# Patient Record
Sex: Female | Born: 1955
Health system: Southern US, Community
[De-identification: ages and names within clinical notes are randomized; demographics above are authoritative.]

## PROBLEM LIST (undated history)

## (undated) DIAGNOSIS — R079 Chest pain, unspecified: Secondary | ICD-10-CM

## (undated) DIAGNOSIS — D219 Benign neoplasm of connective and other soft tissue, unspecified: Secondary | ICD-10-CM

## (undated) DIAGNOSIS — T7840XA Allergy, unspecified, initial encounter: Secondary | ICD-10-CM

## (undated) HISTORY — DX: Benign neoplasm of connective and other soft tissue, unspecified: D21.9

## (undated) HISTORY — DX: Allergy, unspecified, initial encounter: T78.40XA

## (undated) HISTORY — DX: Chest pain, unspecified: R07.9

---
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
## 2000-04-26 ENCOUNTER — Emergency Department (HOSPITAL_COMMUNITY): Admission: EM | Admit: 2000-04-26 | Discharge: 2000-04-26 | Payer: Self-pay | Admitting: Emergency Medicine

## 2000-09-26 ENCOUNTER — Emergency Department (HOSPITAL_COMMUNITY): Admission: EM | Admit: 2000-09-26 | Discharge: 2000-09-26 | Payer: Self-pay | Admitting: Emergency Medicine

## 2000-11-12 ENCOUNTER — Other Ambulatory Visit: Admission: RE | Admit: 2000-11-12 | Discharge: 2000-11-12 | Payer: Self-pay | Admitting: *Deleted

## 2002-02-04 ENCOUNTER — Emergency Department (HOSPITAL_COMMUNITY): Admission: EM | Admit: 2002-02-04 | Discharge: 2002-02-05 | Payer: Self-pay | Admitting: Emergency Medicine

## 2003-07-22 ENCOUNTER — Ambulatory Visit (HOSPITAL_COMMUNITY): Admission: RE | Admit: 2003-07-22 | Discharge: 2003-07-22 | Payer: Self-pay

## 2005-03-14 ENCOUNTER — Emergency Department (HOSPITAL_COMMUNITY): Admission: EM | Admit: 2005-03-14 | Discharge: 2005-03-14 | Payer: Self-pay | Admitting: Family Medicine

## 2005-03-24 ENCOUNTER — Emergency Department (HOSPITAL_COMMUNITY): Admission: EM | Admit: 2005-03-24 | Discharge: 2005-03-24 | Payer: Self-pay | Admitting: Emergency Medicine

## 2005-08-05 ENCOUNTER — Ambulatory Visit (HOSPITAL_COMMUNITY): Admission: RE | Admit: 2005-08-05 | Discharge: 2005-08-05 | Payer: Self-pay

## 2005-12-05 ENCOUNTER — Other Ambulatory Visit: Admission: RE | Admit: 2005-12-05 | Discharge: 2005-12-05 | Payer: Self-pay | Admitting: Gynecology

## 2007-08-13 ENCOUNTER — Ambulatory Visit (HOSPITAL_COMMUNITY): Admission: RE | Admit: 2007-08-13 | Discharge: 2007-08-13 | Payer: Self-pay | Admitting: Obstetrics and Gynecology

## 2007-10-09 ENCOUNTER — Other Ambulatory Visit: Admission: RE | Admit: 2007-10-09 | Discharge: 2007-10-09 | Payer: Self-pay | Admitting: Obstetrics and Gynecology

## 2009-02-25 ENCOUNTER — Emergency Department (HOSPITAL_COMMUNITY): Admission: EM | Admit: 2009-02-25 | Discharge: 2009-02-25 | Payer: Self-pay | Admitting: Family Medicine

## 2010-02-07 ENCOUNTER — Emergency Department (HOSPITAL_COMMUNITY): Admission: EM | Admit: 2010-02-07 | Discharge: 2010-02-07 | Payer: Self-pay | Admitting: Family Medicine

## 2010-06-03 ENCOUNTER — Encounter: Payer: Self-pay | Admitting: Obstetrics and Gynecology

## 2010-06-11 ENCOUNTER — Ambulatory Visit: Admit: 2010-06-11 | Payer: Self-pay | Admitting: Obstetrics and Gynecology

## 2010-07-02 ENCOUNTER — Encounter (INDEPENDENT_AMBULATORY_CARE_PROVIDER_SITE_OTHER): Payer: BC Managed Care – PPO | Admitting: Obstetrics and Gynecology

## 2010-07-02 ENCOUNTER — Other Ambulatory Visit (HOSPITAL_COMMUNITY)
Admission: RE | Admit: 2010-07-02 | Discharge: 2010-07-02 | Disposition: A | Discharge status: Home / Self Care | Payer: BC Managed Care – PPO | Source: Ambulatory Visit | Attending: Obstetrics and Gynecology | Admitting: Obstetrics and Gynecology

## 2010-07-02 DIAGNOSIS — Z13220 Encounter for screening for lipoid disorders: Secondary | ICD-10-CM

## 2010-07-02 DIAGNOSIS — Z124 Encounter for screening for malignant neoplasm of cervix: Secondary | ICD-10-CM | POA: Insufficient documentation

## 2010-07-02 DIAGNOSIS — Z01419 Encounter for gynecological examination (general) (routine) without abnormal findings: Secondary | ICD-10-CM

## 2010-07-02 DIAGNOSIS — Z833 Family history of diabetes mellitus: Secondary | ICD-10-CM

## 2010-07-03 ENCOUNTER — Other Ambulatory Visit: Payer: Self-pay | Admitting: Obstetrics and Gynecology

## 2010-07-04 ENCOUNTER — Other Ambulatory Visit: Payer: BC Managed Care – PPO

## 2010-07-04 ENCOUNTER — Ambulatory Visit (INDEPENDENT_AMBULATORY_CARE_PROVIDER_SITE_OTHER): Payer: BC Managed Care – PPO | Admitting: Obstetrics and Gynecology

## 2010-07-04 DIAGNOSIS — D259 Leiomyoma of uterus, unspecified: Secondary | ICD-10-CM

## 2010-07-04 DIAGNOSIS — R1900 Intra-abdominal and pelvic swelling, mass and lump, unspecified site: Secondary | ICD-10-CM

## 2010-08-09 ENCOUNTER — Encounter (INDEPENDENT_AMBULATORY_CARE_PROVIDER_SITE_OTHER): Payer: BC Managed Care – PPO

## 2010-08-09 DIAGNOSIS — Z13820 Encounter for screening for osteoporosis: Secondary | ICD-10-CM

## 2010-08-15 ENCOUNTER — Ambulatory Visit (INDEPENDENT_AMBULATORY_CARE_PROVIDER_SITE_OTHER): Payer: BC Managed Care – PPO | Admitting: Obstetrics and Gynecology

## 2010-08-15 DIAGNOSIS — R635 Abnormal weight gain: Secondary | ICD-10-CM

## 2010-08-15 DIAGNOSIS — R5381 Other malaise: Secondary | ICD-10-CM

## 2010-08-15 DIAGNOSIS — R5383 Other fatigue: Secondary | ICD-10-CM

## 2010-09-11 ENCOUNTER — Other Ambulatory Visit: Payer: Self-pay | Admitting: Obstetrics and Gynecology

## 2010-09-11 DIAGNOSIS — Z1231 Encounter for screening mammogram for malignant neoplasm of breast: Secondary | ICD-10-CM

## 2010-09-21 ENCOUNTER — Ambulatory Visit (HOSPITAL_COMMUNITY)
Admission: RE | Admit: 2010-09-21 | Discharge: 2010-09-21 | Disposition: A | Discharge status: Home / Self Care | Payer: BC Managed Care – PPO | Source: Ambulatory Visit | Attending: Obstetrics and Gynecology | Admitting: Obstetrics and Gynecology

## 2010-09-21 DIAGNOSIS — Z1231 Encounter for screening mammogram for malignant neoplasm of breast: Secondary | ICD-10-CM

## 2012-04-30 ENCOUNTER — Encounter: Payer: Self-pay | Admitting: Gynecology

## 2012-04-30 ENCOUNTER — Other Ambulatory Visit (HOSPITAL_COMMUNITY)
Admission: RE | Admit: 2012-04-30 | Discharge: 2012-04-30 | Disposition: A | Discharge status: Home / Self Care | Payer: BC Managed Care – PPO | Source: Ambulatory Visit | Attending: Gynecology | Admitting: Gynecology

## 2012-04-30 ENCOUNTER — Ambulatory Visit (INDEPENDENT_AMBULATORY_CARE_PROVIDER_SITE_OTHER): Payer: BC Managed Care – PPO | Admitting: Gynecology

## 2012-04-30 VITALS — BP 108/78 | Ht 63.5 in | Wt 216.0 lb

## 2012-04-30 DIAGNOSIS — D259 Leiomyoma of uterus, unspecified: Secondary | ICD-10-CM

## 2012-04-30 DIAGNOSIS — Z01419 Encounter for gynecological examination (general) (routine) without abnormal findings: Secondary | ICD-10-CM

## 2012-04-30 DIAGNOSIS — Z13220 Encounter for screening for lipoid disorders: Secondary | ICD-10-CM

## 2012-04-30 DIAGNOSIS — Z1151 Encounter for screening for human papillomavirus (HPV): Secondary | ICD-10-CM | POA: Insufficient documentation

## 2012-04-30 LAB — CBC WITH DIFFERENTIAL/PLATELET
HCT: 40.9 % (ref 36.0–46.0)
Hemoglobin: 13.7 g/dL (ref 12.0–15.0)
Lymphocytes Relative: 31 % (ref 12–46)
Lymphs Abs: 1.9 10*3/uL (ref 0.7–4.0)
Monocytes Absolute: 0.4 10*3/uL (ref 0.1–1.0)
Monocytes Relative: 6 % (ref 3–12)
Neutro Abs: 3.7 10*3/uL (ref 1.7–7.7)
WBC: 6.1 10*3/uL (ref 4.0–10.5)

## 2012-04-30 LAB — COMPREHENSIVE METABOLIC PANEL
Albumin: 3.8 g/dL (ref 3.5–5.2)
BUN: 12 mg/dL (ref 6–23)
Calcium: 8.8 mg/dL (ref 8.4–10.5)
Chloride: 105 mEq/L (ref 96–112)
Glucose, Bld: 102 mg/dL — ABNORMAL HIGH (ref 70–99)
Potassium: 4.1 mEq/L (ref 3.5–5.3)

## 2012-04-30 LAB — LIPID PANEL
HDL: 35 mg/dL — ABNORMAL LOW (ref >39)
Triglycerides: 99 mg/dL (ref <150)

## 2012-04-30 NOTE — Progress Notes (Signed)
Kathleen Novak 1955/06/19 [id number]        56 y.o.  G1P1001 for annual exam.  Several issues noted below.  Past medical history,surgical history, medications, allergies, family history and social history were all reviewed and documented in the EPIC chart. ROS:  Was performed and pertinent positives and negatives are included in the history.  Exam: Sherrilyn Rist assistant Filed Vitals:   04/30/12 1443  BP: 108/78  Height: 5' 3.5" (1.613 m)  Weight: 216 lb (97.977 kg)   General appearance  Normal Skin grossly normal Head/Neck normal with no cervical or supraclavicular adenopathy thyroid normal Lungs  clear Cardiac RR, without RMG Abdominal  soft, nontender, without masses, organomegaly or hernia Breasts  examined lying and sitting without masses, retractions, discharge or axillary adenopathy. Pelvic  Ext/BUS/vagina  normal   Cervix  normal Pap/HPV  Uterus  grossly normal size, shape and contour, midline and mobile nontender. Exam somewhat limited by abdominal girth.  Adnexa  Without masses or tenderness    Anus and perineum  normal   Rectovaginal  normal sphincter tone without palpated masses or tenderness.    Assessment/Plan:  56 y.o. G56P1001 female for annual exam.   1. Postmenopausal. Patient without menses or any bleeding. Without significant menopausal symptoms. We'll continue to monitor. 2. Lower abdominal discomfort questionable fullness. History of leiomyoma, multiple and small. Exam is normal. Start with GYN ultrasound to rule out nonpalpable abnormalities. Assuming negative then will follow up with gastroenterology as noted below. 3. Pap smear 06/2010. Pap smear/HPV done today. No history of significant abnormal Pap smears previously. If normal plan 5 year follow up. 4. Mammography 09/2010. Patient's overdo and she knows this. Patient agrees to schedule. SBE monthly reviewed. 5. Colonoscopy. Patient's never had a colonoscopy. I given her the name of the [facility] to call to  arrange for colonoscopy as well as to discuss her lower abdominal discomfort.  Patient agrees to arrange.  6. DEXA 07/2010 normal. Plan 5 year interval follow up. Increase calcium vitamin D reviewed. 7. Health maintenance.  Baseline CBC comprehensive metabolic panel vitamin D TSH lipid profile urinalysis ordered. I did ask her to make an appointment to see a general medical doctor  when she arranges for her colonoscopy to establish care for routine health maintenance issue increased arrange this also. Follow up for ultrasound here otherwise 1 year, sooner as needed.    Dara Lords MD, 4:44 PM 04/30/2012

## 2012-04-30 NOTE — Patient Instructions (Signed)
Call to schedule colonoscopy  Call to Schedule your mammogram  Facilities in [place]: 1)  [facility] [place], [place] Phone: [phone] 2)  [facility], [phone] Phone: [phone] 3)  Dr. Yolanda Bonine at [facility] [address] Phone: [phone]     Mammogram A mammogram is an X-ray test to find changes in a woman's breast. You should get a mammogram if:  You are 21 years of age or older  You have risk factors.   Your doctor recommends that you have one.  BEFORE THE TEST  Do not schedule the test the week before your period, especially if your breasts are sore during this time.  On the day of your mammogram:  Wash your breasts and armpits well. After washing, do not put on any deodorant or talcum powder on until after your test.   Eat and drink as you usually do.   Take your medicines as usual.   If you are diabetic and take insulin, make sure you:   Eat before coming for your test.   Take your insulin as usual.   If you cannot keep your appointment, call before the appointment to cancel. Schedule another appointment.  TEST  You will need to undress from the waist up. You will put on a hospital gown.   Your breast will be put on the mammogram machine, and it will press firmly on your breast with a piece of plastic called a compression paddle. This will make your breast flatter so that the machine can X-ray all parts of your breast.   Both breasts will be X-rayed. Each breast will be X-rayed from above and from the side. An X-ray might need to be taken again if the picture is not good enough.   The mammogram will last about 15 to 30 minutes.  AFTER THE TEST Finding out the results of your test Ask when your test results will be ready. Make sure you get your test results.  Document Released: 07/26/2008 Document Revised: 04/18/2011 Document Reviewed:  07/26/2008 [facility] Patient Information 2012 [place], [place].

## 2012-05-01 ENCOUNTER — Other Ambulatory Visit: Payer: Self-pay | Admitting: *Deleted

## 2012-05-01 DIAGNOSIS — E559 Vitamin D deficiency, unspecified: Secondary | ICD-10-CM

## 2012-05-01 LAB — URINALYSIS W MICROSCOPIC + REFLEX CULTURE
Casts: NONE SEEN
Crystals: NONE SEEN
Glucose, UA: NEGATIVE mg/dL
Hgb urine dipstick: NEGATIVE
Leukocytes, UA: NEGATIVE
Specific Gravity, Urine: 1.017 (ref 1.005–1.030)
pH: 5 (ref 5.0–8.0)

## 2012-05-01 LAB — VITAMIN D 25 HYDROXY (VIT D DEFICIENCY, FRACTURES): Vit D, 25-Hydroxy: 13 ng/mL — ABNORMAL LOW (ref 30–89)

## 2012-05-08 ENCOUNTER — Encounter: Payer: Self-pay | Admitting: Obstetrics and Gynecology

## 2012-05-15 ENCOUNTER — Ambulatory Visit (INDEPENDENT_AMBULATORY_CARE_PROVIDER_SITE_OTHER): Payer: BC Managed Care – PPO

## 2012-05-15 ENCOUNTER — Ambulatory Visit (INDEPENDENT_AMBULATORY_CARE_PROVIDER_SITE_OTHER): Payer: BC Managed Care – PPO | Admitting: Gynecology

## 2012-05-15 ENCOUNTER — Encounter: Payer: Self-pay | Admitting: Gynecology

## 2012-05-15 DIAGNOSIS — D259 Leiomyoma of uterus, unspecified: Secondary | ICD-10-CM

## 2012-05-15 DIAGNOSIS — R109 Unspecified abdominal pain: Secondary | ICD-10-CM

## 2012-05-15 DIAGNOSIS — R1030 Lower abdominal pain, unspecified: Secondary | ICD-10-CM

## 2012-05-15 NOTE — Patient Instructions (Signed)
Follow up with [facility] gastroenterology for further evaluation and colonoscopy.

## 2012-05-15 NOTE — Progress Notes (Signed)
Patient also for ultrasound. Has history of some lower abdominal fullness feelings and discomfort. Her pelvic exam was normal and was asked to do an ultrasound to rule out nonpalpable abnormalities. She does have a history of leiomyoma.  Ultrasound shows uterus overall normal size. Endometrial echo 4.3 mm. Multiple small myomas noted the largest 21 mm. #7 total measured. Right and left ovaries visualized and postmenopausal in appearance. Cul-de-sac negative for fluid.  Assessment and plan: Lower, discomfort and fullness feelings. Suspect GI related. Recommend she follow up with [facility] gastroenterology as I recommended and she agrees to arrange and also pursue a colonoscopy. Otherwise from a GYN standpoint continue observation as I think that her leiomyoma are stable is not getting smaller and not the etiology of her discomfort.  I also reviewed all of her lab work that was drawn during her last visit and discussed her marginal glucose of 102 HDL of 35 LDL of 102 and her vitamin D if 13. She is to start vitamin D 2000 units daily. I recommend a recheck of her vitamin D level in 6 months to a year.

## 2012-05-22 ENCOUNTER — Encounter: Payer: Self-pay | Admitting: Gynecology

## 2012-12-30 ENCOUNTER — Emergency Department (HOSPITAL_COMMUNITY)
Admission: EM | Admit: 2012-12-30 | Discharge: 2012-12-31 | Disposition: A | Discharge status: Home / Self Care | Payer: BC Managed Care – PPO | Attending: Emergency Medicine | Admitting: Emergency Medicine

## 2012-12-30 ENCOUNTER — Encounter (HOSPITAL_COMMUNITY): Payer: Self-pay

## 2012-12-30 DIAGNOSIS — R209 Unspecified disturbances of skin sensation: Secondary | ICD-10-CM | POA: Insufficient documentation

## 2012-12-30 DIAGNOSIS — H538 Other visual disturbances: Secondary | ICD-10-CM | POA: Insufficient documentation

## 2012-12-30 DIAGNOSIS — Z8742 Personal history of other diseases of the female genital tract: Secondary | ICD-10-CM | POA: Insufficient documentation

## 2012-12-30 DIAGNOSIS — Z7982 Long term (current) use of aspirin: Secondary | ICD-10-CM | POA: Insufficient documentation

## 2012-12-30 DIAGNOSIS — H5310 Unspecified subjective visual disturbances: Secondary | ICD-10-CM

## 2012-12-30 NOTE — ED Notes (Signed)
Pt c/o headache starting yesterday, lightheaded when standing, and blurred vision to her Right eye starting this afternoon. No neuro deficits noted, speech is clear, no facial droop, no arm drift, grips and strengths are equal bilateral

## 2012-12-30 NOTE — ED Provider Notes (Signed)
CSN: [id number]     Arrival date & time 12/30/12  2108 History     First MD Initiated Contact with Patient 12/30/12 2304     Chief Complaint  Patient presents with  . Blurred Vision   (Consider location/radiation/quality/duration/timing/severity/associated sxs/prior Treatment) HPI This is a 57 year old female who experienced the sudden onset of shaking of her vision at about 6 PM today. She believes, but is not sure, but the shaking is limited to the right eye. The symptoms are mild but make it difficult to read. She has had about 10 episodes and they last several minutes each. There is an associated slight pressure in the right eye but no frank pain. The right eye waters slightly when this occurs. She had a headache yesterday which she attributed to sinus pressure. She has no headache today. She has no new neurologic deficits. She has a several month history of awakening with numbness in her right arm which usually resolves with movement; it is not occurring now. She also has a six-month history of intermittent lightheadedness on standing.   Past Medical History  Diagnosis Date  . Fibroid    Past Surgical History  Procedure Laterality Date  . Cesarean section    . Tonsillectomy     Family History  Problem Relation Age of Onset  . Diabetes Mother   . Hypertension Mother   . Stroke Mother   . Diabetes Father   . Heart disease Father   . Heart disease Brother   . Heart disease Maternal Grandmother    History  Substance Use Topics  . Smoking status: Never Smoker   . Smokeless tobacco: Never Used  . Alcohol Use: No   OB History   Grav Para Term Preterm Abortions TAB SAB Ect Mult Living   1 1 1       1      Review of Systems  All other systems reviewed and are negative.    Allergies  Other  Home Medications   Current Outpatient Rx  Name  Route  Sig  Dispense  Refill  . aspirin 81 MG tablet   Oral   Take 81 mg by mouth daily.         .  Pseudoephedrine-Guaifenesin (MUCINEX D PO)   Oral   Take 2 tablets by mouth daily as needed (runny nose).          BP 112/64  Pulse 74  Temp(Src) 98.4 F (36.9 C) (Oral)  Resp 16  Ht 5\' 3"  (1.6 m)  Wt 190 lb (86.183 kg)  BMI 33.67 kg/m2  SpO2 98%  Physical Exam General: Well-developed, well-nourished female in no acute distress; appearance consistent with age of record HENT: normocephalic; atraumatic Eyes: pupils equal, round and reactive to light; extraocular muscles intact Neck: supple; no carotid bruits Heart: regular rate and rhythm; no murmurs, rubs or gallops Lungs: clear to auscultation bilaterally Abdomen: soft; nondistended Extremities: No deformity; full range of motion; pulses normal Neurologic: Awake, alert and oriented; motor function intact in all extremities and symmetric; no facial droop; normal coordination speech Skin: Warm and dry Psychiatric: Normal mood and affect    ED Course   Procedures (including critical care time)    MDM   Nursing notes and vitals signs, including pulse oximetry, reviewed.  Summary of this visit's results, reviewed by myself:  Labs:  Results for orders placed during the hospital encounter of 12/30/12 (from the past 24 hour(s))  BASIC METABOLIC PANEL     Status: Abnormal  Collection Time    12/30/12 11:40 PM      Result Value Range   Sodium 140  135 - 145 mEq/L   Potassium 3.9  3.5 - 5.1 mEq/L   Chloride 105  96 - 112 mEq/L   CO2 24  19 - 32 mEq/L   Glucose, Bld 89  70 - 99 mg/dL   BUN 9  6 - 23 mg/dL   Creatinine, Ser 8.29  0.50 - 1.10 mg/dL   Calcium 9.1  8.4 - 56.2 mg/dL   GFR calc non Af Amer 73 (*) >90 mL/min   GFR calc Af Amer 85 (*) >90 mL/min  CBC WITH DIFFERENTIAL     Status: None   Collection Time    12/30/12 11:40 PM      Result Value Range   WBC 7.6  4.0 - 10.5 K/uL   RBC 4.91  3.87 - 5.11 MIL/uL   Hemoglobin 14.4  12.0 - 15.0 g/dL   HCT 13.0  86.5 - 78.4 %   MCV 83.9  78.0 - 100.0 fL   MCH  29.3  26.0 - 34.0 pg   MCHC 35.0  30.0 - 36.0 g/dL   RDW 69.6  29.5 - 28.4 %   Platelets 209  150 - 400 K/uL   Neutrophils Relative % 60  43 - 77 %   Neutro Abs 4.5  1.7 - 7.7 K/uL   Lymphocytes Relative 30  12 - 46 %   Lymphs Abs 2.2  0.7 - 4.0 K/uL   Monocytes Relative 8  3 - 12 %   Monocytes Absolute 0.6  0.1 - 1.0 K/uL   Eosinophils Relative 2  0 - 5 %   Eosinophils Absolute 0.2  0.0 - 0.7 K/uL   Basophils Relative 0  0 - 1 %   Basophils Absolute 0.0  0.0 - 0.1 K/uL    Imaging Studies: Ct Head Wo Contrast  12/31/2012   *RADIOLOGY REPORT*  Clinical Data: Right-sided facial numbness and right arm numbness, with right sided blurry vision  CT HEAD WITHOUT CONTRAST  Technique:  Contiguous axial images were obtained from the base of the skull through the vertex without contrast.  Comparison: None.  Findings: No acute intracranial hemorrhage or infarct.  No extra- axial fluid collection.  Calvarium is intact.  Gray-white matter differentiation is preserved.  No midline shift or mass lesion. CSF containing spaces are normal.  Orbital soft tissues are normal.  Paranasal sinuses and mastoid air cells are clear.  IMPRESSION: No acute intracranial process.  Normal head CT.   Original Report Authenticated By: Rise Mu, M.D.      11:23 PM Patient having intermittent symptoms at this time. Patient was observed during these symptoms and no fasciculation of her eyelids were seen and no nystagmus or other movement of her globes was seen.  2:38 AM Discussed with Dr. Amada Jupiter of neurology. He advises the patient followup with ophthalmology. The patient has an eye doctor whom she can followup with. She was advised to return for worsening or changing symptoms. The patient states her symptoms have been improving since being in the ED.  Hanley Seamen, MD 12/31/12 [phone]

## 2012-12-30 NOTE — ED Notes (Addendum)
Pt states the blurred vision is not as bad as it was earlier when she was on her CPU, however pt states her vision is still not normal. Pt states she has never experienced something like this before and it is concerning to her. Pt states she still has difficulty seeing words. Pt wears glasses on a daily basis.

## 2012-12-31 ENCOUNTER — Emergency Department (HOSPITAL_COMMUNITY): Payer: BC Managed Care – PPO

## 2012-12-31 ENCOUNTER — Encounter (HOSPITAL_COMMUNITY): Payer: Self-pay | Admitting: Radiology

## 2012-12-31 LAB — CBC WITH DIFFERENTIAL/PLATELET
Basophils Absolute: 0 10*3/uL (ref 0.0–0.1)
Basophils Relative: 0 % (ref 0–1)
Eosinophils Absolute: 0.2 10*3/uL (ref 0.0–0.7)
Eosinophils Relative: 2 % (ref 0–5)
HCT: 41.2 % (ref 36.0–46.0)
MCH: 29.3 pg (ref 26.0–34.0)
MCHC: 35 g/dL (ref 30.0–36.0)
MCV: 83.9 fL (ref 78.0–100.0)
Monocytes Absolute: 0.6 10*3/uL (ref 0.1–1.0)
Monocytes Relative: 8 % (ref 3–12)
RDW: 14.6 % (ref 11.5–15.5)

## 2012-12-31 LAB — BASIC METABOLIC PANEL
BUN: 9 mg/dL (ref 6–23)
CO2: 24 mEq/L (ref 19–32)
Chloride: 105 mEq/L (ref 96–112)
Creatinine, Ser: 0.86 mg/dL (ref 0.50–1.10)
GFR calc Af Amer: 85 mL/min — ABNORMAL LOW (ref >90)
Glucose, Bld: 89 mg/dL (ref 70–99)

## 2012-12-31 NOTE — ED Notes (Signed)
Pt states she feels "lightheaded"

## 2013-05-03 ENCOUNTER — Encounter: Payer: Self-pay | Admitting: Gynecology

## 2013-05-03 ENCOUNTER — Ambulatory Visit (INDEPENDENT_AMBULATORY_CARE_PROVIDER_SITE_OTHER): Payer: BC Managed Care – PPO | Admitting: Gynecology

## 2013-05-03 VITALS — BP 108/64 | Ht 63.5 in | Wt 213.4 lb

## 2013-05-03 DIAGNOSIS — Z01419 Encounter for gynecological examination (general) (routine) without abnormal findings: Secondary | ICD-10-CM

## 2013-05-03 DIAGNOSIS — N951 Menopausal and female climacteric states: Secondary | ICD-10-CM

## 2013-05-03 DIAGNOSIS — D251 Intramural leiomyoma of uterus: Secondary | ICD-10-CM

## 2013-05-03 LAB — LIPID PANEL
HDL: 32 mg/dL — ABNORMAL LOW (ref >39)
LDL Cholesterol: 129 mg/dL — ABNORMAL HIGH (ref 0–99)
Triglycerides: 90 mg/dL (ref <150)
VLDL: 18 mg/dL (ref 0–40)

## 2013-05-03 LAB — COMPREHENSIVE METABOLIC PANEL
AST: 32 U/L (ref 0–37)
Albumin: 3.9 g/dL (ref 3.5–5.2)
Alkaline Phosphatase: 54 U/L (ref 39–117)
BUN: 14 mg/dL (ref 6–23)
Glucose, Bld: 77 mg/dL (ref 70–99)
Potassium: 4 mEq/L (ref 3.5–5.3)
Sodium: 138 mEq/L (ref 135–145)
Total Bilirubin: 0.4 mg/dL (ref 0.3–1.2)

## 2013-05-03 LAB — CBC WITH DIFFERENTIAL/PLATELET
Basophils Absolute: 0 10*3/uL (ref 0.0–0.1)
Basophils Relative: 0 % (ref 0–1)
Eosinophils Absolute: 0.2 10*3/uL (ref 0.0–0.7)
MCH: 27.9 pg (ref 26.0–34.0)
MCHC: 34.5 g/dL (ref 30.0–36.0)
Neutro Abs: 3.4 10*3/uL (ref 1.7–7.7)
Neutrophils Relative %: 54 % (ref 43–77)
RDW: 15.2 % (ref 11.5–15.5)

## 2013-05-03 LAB — TSH: TSH: 2.347 u[IU]/mL (ref 0.350–4.500)

## 2013-05-03 NOTE — Progress Notes (Signed)
Kathleen Novak August 07, 1955 [id number]        57 y.o.  G1P1001 for annual exam.  Overall doing well. Several issues noted below.  Past medical history,surgical history, problem list, medications, allergies, family history and social history were all reviewed and documented in the EPIC chart.  ROS:  Performed and pertinent positives and negatives are included in the history, assessment and plan .  Exam: Sherrilyn Rist assistant Filed Vitals:   05/03/13 1521  BP: 108/64  Height: 5' 3.5" (1.613 m)  Weight: 213 lb 6.4 oz (96.798 kg)   General appearance  Normal Skin grossly normal Head/Neck normal with no cervical or supraclavicular adenopathy thyroid normal Lungs  clear Cardiac RR, without RMG Abdominal  soft, nontender, without masses, organomegaly or hernia Breasts  examined lying and sitting without masses, retractions, discharge or axillary adenopathy. Pelvic  Ext/BUS/vagina  Normal with atrophic changes  Cervix  Normal with atrophic changes  Uterus  anteverted, normal size, shape and contour, midline and mobile nontender   Adnexa  Without masses or tenderness    Anus and perineum  Normal   Rectovaginal  Normal sphincter tone without palpated masses or tenderness.    Assessment/Plan:  57 y.o. G58P1001 female for annual exam.   1. Postmenopausal/atrophic genital changes. Having some hot flashes. Options to include OTC products, HRT and non-hormonal pharmacologic agents reviewed. Patient wants to try OTC products with soy first. Will followup if continues to be an issue. No bleeding at all. Otherwise doing well without significant vaginal dryness. Is not sexually active. Call if any vaginal bleeding. 2. History of multiple small myomas. Exam shows normal uterine size. We'll continue to monitor with annual exams. 3. Mammography overdue. Patient knows to schedule an agrees to do so. SBE monthly reviewed. 4. Pap/HPV negative 2013. No Pap smear done today. No history of significant abnormal Pap  smears previously. Plan repeat in 3-5 year interval. 5. DEXA 2012 normal. Plan repeat at 5 year interval. Increase calcium vitamin D reviewed. 6. Colonoscopy never. Patient does to schedule an agrees to do so. Benefits of early detection reviewed. 7. Health maintenance. Baseline CBC comprehensive metabolic panel lipid profile TSH urinalysis vitamin D done. Followup for both mammogram and scheduling colonoscopy, otherwise annually.   Note: This document was prepared with digital dictation and possible smart phrase technology. Any transcriptional errors that result from this process are unintentional.   Dara Lords MD, 4:32 PM 05/03/2013

## 2013-05-03 NOTE — Patient Instructions (Addendum)
Schedule colonoscopy with [facility] at [phone] or [facility] at [phone]  Call to Schedule your mammogram  Facilities in [place]: 1)  [facility], [address] Phone: [phone] 2)  [facility]. [facility], [address] Phone: [phone] 3)  Dr. Bertrand at [facility]  [address] Phone: [phone]     Mammogram A mammogram is an X-ray test to find changes in a woman's breast. You should get a mammogram if:  You are 40 years of age or older  You have risk factors.   Your doctor recommends that you have one.  BEFORE THE TEST  Do not schedule the test the week before your period, especially if your breasts are sore during this time.  On the day of your mammogram:  Wash your breasts and armpits well. After washing, do not put on any deodorant or talcum powder on until after your test.   Eat and drink as you usually do.   Take your medicines as usual.   If you are diabetic and take insulin, make sure you:   Eat before coming for your test.   Take your insulin as usual.   If you cannot keep your appointment, call before the appointment to cancel. Schedule another appointment.  TEST  You will need to undress from the waist up. You will put on a hospital gown.   Your breast will be put on the mammogram machine, and it will press firmly on your breast with a piece of plastic called a compression paddle. This will make your breast flatter so that the machine can X-ray all parts of your breast.   Both breasts will be X-rayed. Each breast will be X-rayed from above and from the side. An X-ray might need to be taken again if the picture is not good enough.   The mammogram will last about 15 to 30 minutes.  AFTER THE TEST Finding out the results of your test Ask when your test results will be ready. Make sure you get your test  results.  Document Released: 07/26/2008 Document Revised: 04/18/2011 Document Reviewed: 07/26/2008 [employer] Patient Information 2012 [employer].   

## 2013-05-04 LAB — URINALYSIS W MICROSCOPIC + REFLEX CULTURE
Hgb urine dipstick: NEGATIVE
Ketones, ur: 15 mg/dL — AB
Leukocytes, UA: NEGATIVE
Nitrite: NEGATIVE
Protein, ur: NEGATIVE mg/dL
Urobilinogen, UA: 1 mg/dL (ref 0.0–1.0)
pH: 6 (ref 5.0–8.0)

## 2013-05-04 LAB — VITAMIN D 25 HYDROXY (VIT D DEFICIENCY, FRACTURES): Vit D, 25-Hydroxy: 15 ng/mL — ABNORMAL LOW (ref 30–89)

## 2013-05-05 ENCOUNTER — Other Ambulatory Visit: Payer: Self-pay | Admitting: Women's Health

## 2013-05-05 ENCOUNTER — Other Ambulatory Visit: Payer: Self-pay | Admitting: Gynecology

## 2013-05-05 DIAGNOSIS — E559 Vitamin D deficiency, unspecified: Secondary | ICD-10-CM

## 2013-05-05 DIAGNOSIS — E7800 Pure hypercholesterolemia, unspecified: Secondary | ICD-10-CM

## 2013-05-05 LAB — URINE CULTURE
Colony Count: NO GROWTH
Organism ID, Bacteria: NO GROWTH

## 2013-06-28 ENCOUNTER — Encounter (HOSPITAL_COMMUNITY): Payer: Self-pay | Admitting: Emergency Medicine

## 2013-06-28 ENCOUNTER — Emergency Department (INDEPENDENT_AMBULATORY_CARE_PROVIDER_SITE_OTHER)
Admission: EM | Admit: 2013-06-28 | Discharge: 2013-06-28 | Disposition: A | Discharge status: Home / Self Care | Payer: BC Managed Care – PPO | Source: Home / Self Care | Attending: Emergency Medicine | Admitting: Emergency Medicine

## 2013-06-28 DIAGNOSIS — H6120 Impacted cerumen, unspecified ear: Secondary | ICD-10-CM

## 2013-06-28 MED ORDER — NEOMYCIN-POLYMYXIN-HC 3.5-10000-1 OT SUSP: 3.0000 [drp] | Freq: Four times a day (QID) | OTIC | Status: DC

## 2013-06-28 NOTE — ED Provider Notes (Signed)
  Chief Complaint   Chief Complaint  Patient presents with  . Otalgia    History of Present Illness   Kathleen Novak is a 58 year old female who has had a three-day history of a sensation of her left ear being stopped up and painful. She denies any drainage. The right ear is normal. She denies any fever, chills, headache, nasal congestion, rhinorrhea, sore throat, or adenopathy.  Review of Systems   Other than as noted above, the patient denies any of the following symptoms: Systemic:  No fevers, chills, or sweats. Eye:  No redness, pain, discharge, itching, blurred vision, or diplopia. ENT:  No headache, nasal congestion, sneezing, itching, epistaxis, ear pain, congestion, decreased hearing, ringing in ears, vertigo, or tinnitus.  No oral lesions, sore throat, pain on swallowing, or hoarseness. Neck:  No mass, tenderness or adenopathy. Lungs:  No coughing, wheezing, or shortness of breath. Skin:  No rash or itching.  [place]   Past medical history, family history, social history, meds, and allergies were reviewed.   Physical Examination     Vital signs:  BP 115/82  Pulse 67  Temp(Src) 98.4 F (36.9 C) (Oral)  Resp 16  SpO2 95% General:  Alert and oriented.  In no distress.  Skin warm and dry. Eye:  PERRL, full EOMs, lids and conjunctiva normal.   ENT:  There is a large wax impaction in the left ear canal. This appeared to be situated in the distal ear canal, therefore it was removed with a wax curet with some difficulty. There was no bleeding, but the canal afterwards was irritated. The TM itself is normal, the right canal and TM were normal.  Nasal mucosa not congested and without drainage.  Mucous membranes moist, no oral lesions, normal dentition, pharynx clear.  No cranial or facial pain to palplation. Neck:  Supple, full ROM.  No adenopathy, tenderness or mass.  Thyroid normal. Lungs:  Breath sounds clear and equal bilaterally.  No wheezes, rales or rhonchi. Heart:   Rhythm regular, without extrasystoles.  No gallops or murmers. Skin:  Clear, warm and dry.   Assessment   The encounter diagnosis was Cerumen impaction.  Plan    1.  Meds:  The following meds were prescribed:   Discharge Medication List as of 06/28/2013  1:20 PM      2.  Patient Education/Counseling:  The patient was given appropriate handouts, self care instructions, and instructed in symptomatic relief.  Advised patient not to get any water in the ear for the next week and to wear something over her ear is been going out in the cold weather. Suggested she not use Q-tips and instead use Debrox drops once a month.  3.  Follow up:  The patient was told to follow up here if no better in 3 to 4 days, or sooner if becoming worse in any way, and given some red flag symptoms such as worsening pain or difficulty hearing which would prompt immediate return.       Harden Mo, MD 06/28/13 [phone]

## 2013-06-28 NOTE — ED Notes (Signed)
C/o left ear pain.  Since 2/14  States feels something hard in ear.  Denies any other symptoms.

## 2013-06-28 NOTE — Discharge Instructions (Signed)
Use Debrox drops monthly to prevent recurrence of wax impaction.  Do not use Q-tips.

## 2013-08-12 ENCOUNTER — Encounter (HOSPITAL_COMMUNITY): Payer: Self-pay | Admitting: Emergency Medicine

## 2013-08-12 ENCOUNTER — Emergency Department (HOSPITAL_COMMUNITY)
Admission: EM | Admit: 2013-08-12 | Discharge: 2013-08-12 | Disposition: A | Discharge status: Home / Self Care | Payer: BC Managed Care – PPO | Source: Home / Self Care

## 2013-08-12 DIAGNOSIS — T148 Other injury of unspecified body region: Secondary | ICD-10-CM

## 2013-08-12 DIAGNOSIS — W57XXXA Bitten or stung by nonvenomous insect and other nonvenomous arthropods, initial encounter: Secondary | ICD-10-CM

## 2013-08-12 DIAGNOSIS — Y9229 Other specified public building as the place of occurrence of the external cause: Secondary | ICD-10-CM

## 2013-08-12 MED ORDER — DEXAMETHASONE SODIUM PHOSPHATE 100 MG/10ML IJ SOLN
10.0000 mg | Freq: Once | INTRAMUSCULAR | Status: AC
  Administered 2013-08-12: 10 mg via INTRAMUSCULAR

## 2013-08-12 MED ORDER — DOXYCYCLINE HYCLATE 100 MG PO CAPS: 100.0000 mg | ORAL_CAPSULE | Freq: Two times a day (BID) | ORAL | Status: DC

## 2013-08-12 MED ORDER — DEXAMETHASONE SODIUM PHOSPHATE 10 MG/ML IJ SOLN
INTRAMUSCULAR | Status: AC
  Filled 2013-08-12: qty 1

## 2013-08-12 NOTE — ED Provider Notes (Signed)
CSN: [id number]     Arrival date & time 08/12/13  1853 History   None    Chief Complaint  Patient presents with  . Insect Bite   (Consider location/radiation/quality/duration/timing/severity/associated sxs/prior Treatment) HPI Comments: 58 yo female stayed at a [place] last night and woke up with bug bite on right lateral chest. She notes pain/ burning/ redness/ swelling. She has not taken any OTC or allergy medicine. She notes only the one bite.   Past Medical History  Diagnosis Date  . Fibroid    Past Surgical History  Procedure Laterality Date  . Cesarean section  1984  . Tonsillectomy  1968   Family History  Problem Relation Age of Onset  . Diabetes Mother   . Hypertension Mother   . Stroke Mother   . Diabetes Father   . Heart disease Father   . Heart disease Brother   . Heart disease Maternal Grandmother    History  Substance Use Topics  . Smoking status: Never Smoker   . Smokeless tobacco: Never Used  . Alcohol Use: No   OB History   Grav Para Term Preterm Abortions TAB SAB Ect Mult Living   1 1 1       1      Review of Systems  Skin: Positive for wound.  All other systems reviewed and are negative.    Allergies  Other  Home Medications   Current Outpatient Rx  Name  Route  Sig  Dispense  Refill  . aspirin 81 MG tablet   Oral   Take 81 mg by mouth daily.         Marland Kitchen neomycin-polymyxin-hydrocortisone (CORTISPORIN) 3.5-10000-1 otic suspension   Left Ear   Place 3 drops into the left ear 4 (four) times daily.   10 mL   0    BP 109/78  Pulse 66  Temp(Src) 97.3 F (36.3 C) (Oral)  Resp 18  SpO2 96% Physical Exam  Nursing note and vitals reviewed. Constitutional: She is oriented to person, place, and time. She appears well-developed.  Pulmonary/Chest:    Musculoskeletal: Normal range of motion.  Neurological: She is alert and oriented to person, place, and time.  Skin: Skin is warm and dry. There is erythema.    ED Course  Procedures  (including critical care time) Labs Review Labs Reviewed - No data to display Imaging Review No results found.   MDM   1. Bug bite without infection   If fever/ drainage/ increased pain start antibiotics Doxy 100 mg AD. Take Zyrtec 10 mg AD, hygiene explained   Ardis Hughs, PA-C 08/12/13 2108

## 2013-08-12 NOTE — ED Notes (Signed)
Was staying in a motel in [place] for 3 days.  Noted a red raised area to R post. chest yesterday morning with itching.

## 2013-08-12 NOTE — Discharge Instructions (Signed)
Insect Bite If fever/ drainage/ increased pain start antibiotics. Take Zyrtec 10 mg AD Mosquitoes, flies, fleas, bedbugs, and other insects can bite. Insect bites are different from insect stings. The bite may be red, puffy (swollen), and itchy for 2 to 4 days. Most bites get better on their own. HOME CARE   Do not scratch the bite.  Keep the bite clean and dry. Wash the bite with soap and water.  Put ice on the bite.  Put ice in a plastic bag.  Place a towel between your skin and the bag.  Leave the ice on for 20 minutes, 4 times a day. Do this for the first 2 to 3 days, or as told by your doctor.  You may use medicated lotions or creams to lessen itching as told by your doctor.  Only take medicines as told by your doctor.  If you are given medicines (antibiotics), take them as told. Finish them even if you start to feel better. You may need a tetanus shot if:  You cannot remember when you had your last tetanus shot.  You have never had a tetanus shot.  The injury broke your skin. If you need a tetanus shot and you choose not to have one, you may get tetanus. Sickness from tetanus can be serious. GET HELP RIGHT AWAY IF:   You have more pain, redness, or puffiness.  You see a red line on the skin coming from the bite.  You have a fever.  You have joint pain.  You have a headache or neck pain.  You feel weak.  You have a rash.  You have chest pain, or you are short of breath.  You have belly (abdominal) pain.  You feel sick to your stomach (nauseous) or throw up (vomit).  You feel very tired or sleepy. MAKE SURE YOU:   Understand these instructions.  Will watch your condition.  Will get help right away if you are not doing well or get worse. Document Released: 04/26/2000 Document Revised: 07/22/2011 Document Reviewed: 11/28/2010 [facility] Patient Information 2014 [place], [place].

## 2013-08-13 NOTE — ED Provider Notes (Signed)
Medical screening examination/treatment/procedure(s) were performed by a resident physician or non-physician practitioner and as the supervising physician I was immediately available for consultation/collaboration.  Lynne Leader, MD   Gregor Hams, MD 08/13/13 [phone]

## 2014-03-14 ENCOUNTER — Encounter (HOSPITAL_COMMUNITY): Payer: Self-pay | Admitting: Emergency Medicine

## 2014-05-04 ENCOUNTER — Encounter: Payer: BC Managed Care – PPO | Admitting: Gynecology

## 2014-06-17 ENCOUNTER — Ambulatory Visit (INDEPENDENT_AMBULATORY_CARE_PROVIDER_SITE_OTHER): Payer: BLUE CROSS/BLUE SHIELD | Admitting: Gynecology

## 2014-06-17 ENCOUNTER — Encounter: Payer: Self-pay | Admitting: Gynecology

## 2014-06-17 VITALS — BP 122/80 | Ht 63.0 in | Wt 218.0 lb

## 2014-06-17 DIAGNOSIS — Z01419 Encounter for gynecological examination (general) (routine) without abnormal findings: Secondary | ICD-10-CM

## 2014-06-17 DIAGNOSIS — D251 Intramural leiomyoma of uterus: Secondary | ICD-10-CM

## 2014-06-17 LAB — COMPREHENSIVE METABOLIC PANEL
ALBUMIN: 4 g/dL (ref 3.5–5.2)
ALK PHOS: 59 U/L (ref 39–117)
ALT: 11 U/L (ref 0–35)
AST: 14 U/L (ref 0–37)
BILIRUBIN TOTAL: 0.3 mg/dL (ref 0.2–1.2)
BUN: 11 mg/dL (ref 6–23)
CO2: 27 mEq/L (ref 19–32)
CREATININE: 0.99 mg/dL (ref 0.50–1.10)
Calcium: 9.1 mg/dL (ref 8.4–10.5)
Chloride: 102 mEq/L (ref 96–112)
Glucose, Bld: 83 mg/dL (ref 70–99)
Potassium: 4 mEq/L (ref 3.5–5.3)
Sodium: 137 mEq/L (ref 135–145)
Total Protein: 6.9 g/dL (ref 6.0–8.3)

## 2014-06-17 LAB — CBC WITH DIFFERENTIAL/PLATELET
BASOS ABS: 0 10*3/uL (ref 0.0–0.1)
BASOS PCT: 0 % (ref 0–1)
Eosinophils Absolute: 0.1 10*3/uL (ref 0.0–0.7)
Eosinophils Relative: 2 % (ref 0–5)
HCT: 42.2 % (ref 36.0–46.0)
Hemoglobin: 14.1 g/dL (ref 12.0–15.0)
Lymphocytes Relative: 36 % (ref 12–46)
Lymphs Abs: 2.1 10*3/uL (ref 0.7–4.0)
MCH: 28 pg (ref 26.0–34.0)
MCHC: 33.4 g/dL (ref 30.0–36.0)
MCV: 83.7 fL (ref 78.0–100.0)
MONO ABS: 0.5 10*3/uL (ref 0.1–1.0)
MPV: 10.7 fL (ref 8.6–12.4)
Monocytes Relative: 9 % (ref 3–12)
NEUTROS ABS: 3.1 10*3/uL (ref 1.7–7.7)
NEUTROS PCT: 53 % (ref 43–77)
Platelets: 213 10*3/uL (ref 150–400)
RBC: 5.04 MIL/uL (ref 3.87–5.11)
RDW: 15.3 % (ref 11.5–15.5)
WBC: 5.8 10*3/uL (ref 4.0–10.5)

## 2014-06-17 LAB — LIPID PANEL
Cholesterol: 168 mg/dL (ref 0–200)
HDL: 34 mg/dL — ABNORMAL LOW (ref >39)
LDL Cholesterol: 113 mg/dL — ABNORMAL HIGH (ref 0–99)
TRIGLYCERIDES: 103 mg/dL (ref <150)
Total CHOL/HDL Ratio: 4.9 Ratio
VLDL: 21 mg/dL (ref 0–40)

## 2014-06-17 NOTE — Patient Instructions (Signed)
Schedule your mammogram.  Schedule your colonoscopy.  You may obtain a copy of any labs that were done today by logging onto MyChart as outlined in the instructions provided with your AVS (after visit summary). The office will not call with normal lab results but certainly if there are any significant abnormalities then we will contact you.   Health Maintenance, Female A healthy lifestyle and preventative care can promote health and wellness.  Maintain regular health, dental, and eye exams.  Eat a healthy diet. Foods like vegetables, fruits, whole grains, low-fat dairy products, and lean protein foods contain the nutrients you need without too many calories. Decrease your intake of foods high in solid fats, added sugars, and salt. Get information about a proper diet from your caregiver, if necessary.  Regular physical exercise is one of the most important things you can do for your health. Most adults should get at least 150 minutes of moderate-intensity exercise (any activity that increases your heart rate and causes you to sweat) each week. In addition, most adults need muscle-strengthening exercises on 2 or more days a week.   Maintain a healthy weight. The body mass index (BMI) is a screening tool to identify possible weight problems. It provides an estimate of body fat based on height and weight. Your caregiver can help determine your BMI, and can help you achieve or maintain a healthy weight. For adults 20 years and older:  A BMI below 18.5 is considered underweight.  A BMI of 18.5 to 24.9 is normal.  A BMI of 25 to 29.9 is considered overweight.  A BMI of 30 and above is considered obese.  Maintain normal blood lipids and cholesterol by exercising and minimizing your intake of saturated fat. Eat a balanced diet with plenty of fruits and vegetables. Blood tests for lipids and cholesterol should begin at age 33 and be repeated every 5 years. If your lipid or cholesterol levels are  high, you are over 50, or you are a high risk for heart disease, you may need your cholesterol levels checked more frequently.Ongoing high lipid and cholesterol levels should be treated with medicines if diet and exercise are not effective.  If you smoke, find out from your caregiver how to quit. If you do not use tobacco, do not start.  Lung cancer screening is recommended for adults aged 102 80 years who are at high risk for developing lung cancer because of a history of smoking. Yearly low-dose computed tomography (CT) is recommended for people who have at least a 30-pack-year history of smoking and are a current smoker or have quit within the past 15 years. A pack year of smoking is smoking an average of 1 pack of cigarettes a day for 1 year (for example: 1 pack a day for 30 years or 2 packs a day for 15 years). Yearly screening should continue until the smoker has stopped smoking for at least 15 years. Yearly screening should also be stopped for people who develop a health problem that would prevent them from having lung cancer treatment.  If you are pregnant, do not drink alcohol. If you are breastfeeding, be very cautious about drinking alcohol. If you are not pregnant and choose to drink alcohol, do not exceed 1 drink per day. One drink is considered to be 12 ounces (355 mL) of beer, 5 ounces (148 mL) of wine, or 1.5 ounces (44 mL) of liquor.  Avoid use of street drugs. Do not share needles with anyone. Ask for help  if you need support or instructions about stopping the use of drugs.  High blood pressure causes heart disease and increases the risk of stroke. Blood pressure should be checked at least every 1 to 2 years. Ongoing high blood pressure should be treated with medicines, if weight loss and exercise are not effective.  If you are 32 to 60 years old, ask your caregiver if you should take aspirin to prevent strokes.  Diabetes screening involves taking a blood sample to check your fasting  blood sugar level. This should be done once every 3 years, after age 76, if you are within normal weight and without risk factors for diabetes. Testing should be considered at a younger age or be carried out more frequently if you are overweight and have at least 1 risk factor for diabetes.  Breast cancer screening is essential preventative care for women. You should practice "breast self-awareness." This means understanding the normal appearance and feel of your breasts and may include breast self-examination. Any changes detected, no matter how small, should be reported to a caregiver. Women in their 60s and 30s should have a clinical breast exam (CBE) by a caregiver as part of a regular health exam every 1 to 3 years. After age 36, women should have a CBE every year. Starting at age 62, women should consider having a mammogram (breast X-ray) every year. Women who have a family history of breast cancer should talk to their caregiver about genetic screening. Women at a high risk of breast cancer should talk to their caregiver about having an MRI and a mammogram every year.  Breast cancer gene (BRCA)-related cancer risk assessment is recommended for women who have family members with BRCA-related cancers. BRCA-related cancers include breast, ovarian, tubal, and peritoneal cancers. Having family members with these cancers may be associated with an increased risk for harmful changes (mutations) in the breast cancer genes BRCA1 and BRCA2. Results of the assessment will determine the need for genetic counseling and BRCA1 and BRCA2 testing.  The Pap test is a screening test for cervical cancer. Women should have a Pap test starting at age 61. Between ages 109 and 68, Pap tests should be repeated every 2 years. Beginning at age 75, you should have a Pap test every 3 years as long as the past 3 Pap tests have been normal. If you had a hysterectomy for a problem that was not cancer or a condition that could lead to  cancer, then you no longer need Pap tests. If you are between ages 52 and 73, and you have had normal Pap tests going back 10 years, you no longer need Pap tests. If you have had past treatment for cervical cancer or a condition that could lead to cancer, you need Pap tests and screening for cancer for at least 20 years after your treatment. If Pap tests have been discontinued, risk factors (such as a new sexual partner) need to be reassessed to determine if screening should be resumed. Some women have medical problems that increase the chance of getting cervical cancer. In these cases, your caregiver may recommend more frequent screening and Pap tests.  The human papillomavirus (HPV) test is an additional test that may be used for cervical cancer screening. The HPV test looks for the virus that can cause the cell changes on the cervix. The cells collected during the Pap test can be tested for HPV. The HPV test could be used to screen women aged 74 years and older, and  should be used in women of any age who have unclear Pap test results. After the age of 61, women should have HPV testing at the same frequency as a Pap test.  Colorectal cancer can be detected and often prevented. Most routine colorectal cancer screening begins at the age of 48 and continues through age 10. However, your caregiver may recommend screening at an earlier age if you have risk factors for colon cancer. On a yearly basis, your caregiver may provide home test kits to check for hidden blood in the stool. Use of a small camera at the end of a tube, to directly examine the colon (sigmoidoscopy or colonoscopy), can detect the earliest forms of colorectal cancer. Talk to your caregiver about this at age 70, when routine screening begins. Direct examination of the colon should be repeated every 5 to 10 years through age 19, unless early forms of pre-cancerous polyps or small growths are found.  Hepatitis C blood testing is recommended for  all people born from [birth year] through 1966 and any individual with known risks for hepatitis C.  Practice safe sex. Use condoms and avoid high-risk sexual practices to reduce the spread of sexually transmitted infections (STIs). Sexually active women aged 73 and younger should be checked for Chlamydia, which is a common sexually transmitted infection. Older women with new or multiple partners should also be tested for Chlamydia. Testing for other STIs is recommended if you are sexually active and at increased risk.  Osteoporosis is a disease in which the bones lose minerals and strength with aging. This can result in serious bone fractures. The risk of osteoporosis can be identified using a bone density scan. Women ages 29 and over and women at risk for fractures or osteoporosis should discuss screening with their caregivers. Ask your caregiver whether you should be taking a calcium supplement or vitamin D to reduce the rate of osteoporosis.  Menopause can be associated with physical symptoms and risks. Hormone replacement therapy is available to decrease symptoms and risks. You should talk to your caregiver about whether hormone replacement therapy is right for you.  Use sunscreen. Apply sunscreen liberally and repeatedly throughout the day. You should seek shade when your shadow is shorter than you. Protect yourself by wearing long sleeves, pants, a wide-brimmed hat, and sunglasses year round, whenever you are outdoors.  Notify your caregiver of new moles or changes in moles, especially if there is a change in shape or color. Also notify your caregiver if a mole is larger than the size of a pencil eraser.  Stay current with your immunizations. Document Released: 11/12/2010 Document Revised: 08/24/2012 Document Reviewed: 11/12/2010 [facility] Patient Information 2014 [place].

## 2014-06-17 NOTE — Progress Notes (Signed)
Kathleen Novak 01-21-1956 [id number]        59 y.o.  G1P1001 for annual exam.  Several issues noted below.  Past medical history,surgical history, problem list, medications, allergies, family history and social history were all reviewed and documented as reviewed in the EPIC chart.  ROS:  Performed with pertinent positives and negatives included in the history, assessment and plan.   Additional significant findings :  none   Exam: Kim Counsellor Vitals:   06/17/14 1402  BP: 122/80  Height: 5\' 3"  (1.6 m)  Weight: 218 lb (98.884 kg)   General appearance:  Normal affect, orientation and appearance. Skin: Grossly normal HEENT: Without gross lesions.  No cervical or supraclavicular adenopathy. Thyroid normal.  Lungs:  Clear without wheezing, rales or rhonchi Cardiac: RR, without RMG Abdominal:  Soft, nontender, without masses, guarding, rebound, organomegaly or hernia Breasts:  Examined lying and sitting without masses, retractions, discharge or axillary adenopathy. Pelvic:  Ext/BUS/vagina with mild atrophic changes  Cervix with atrophic changes  Uterus axial to retroverted, normal size, shape and contour, midline and mobile nontender   Adnexa  Without masses or tenderness    Anus and perineum  Normal   Rectovaginal  Normal sphincter tone without palpated masses or tenderness.    Assessment/Plan:  60 y.o. G45P1001 female for annual exam.   1. Postmenopausal/atrophic genital changes. Patient without significant symptoms of hot flashes, night sweats, vaginal dryness or dyspareunia. No vaginal bleeding. Continue to monitor and report any vaginal bleeding. 2. Mammography 2012. Patient knows she is way overdue. Patient will schedule now. SBE monthly reviewed. 3. Colonoscopy never. Patient knows she is way overdue and agrees to schedule with Dr. Collene Mares. 4. Pap smear/HPV negative 04/2012. No Pap smear done today. No history of significant abnormal Pap smears. Plan repeat Pap smear at  3-5 year interval per current screening guidelines. 5. DEXA 2012 normal. Plan repeat furtherance of menopause. Is taking supplemental vitamin D. We'll check vitamin D level today. 6. Difficulty losing weight. Patient admits to not exercising regularly. We'll check baseline TSH and encouraged regular exercise and diet. 7. Health maintenance. Baseline CBC comprehensive metabolic panel lipid profile urinalysis TSH vitamin D ordered. Follow up in one year, sooner as needed.     Anastasio Auerbach MD, 2:35 PM 06/17/2014

## 2014-06-18 LAB — URINALYSIS W MICROSCOPIC + REFLEX CULTURE
BACTERIA UA: NONE SEEN
BILIRUBIN URINE: NEGATIVE
CRYSTALS: NONE SEEN
Casts: NONE SEEN
Glucose, UA: NEGATIVE mg/dL
Hgb urine dipstick: NEGATIVE
Ketones, ur: NEGATIVE mg/dL
Leukocytes, UA: NEGATIVE
Nitrite: NEGATIVE
Protein, ur: NEGATIVE mg/dL
Squamous Epithelial / LPF: NONE SEEN
Urobilinogen, UA: 0.2 mg/dL (ref 0.0–1.0)
pH: 6 (ref 5.0–8.0)

## 2014-06-18 LAB — TSH: TSH: 2.688 u[IU]/mL (ref 0.350–4.500)

## 2014-06-18 LAB — VITAMIN D 25 HYDROXY (VIT D DEFICIENCY, FRACTURES): Vit D, 25-Hydroxy: 40 ng/mL (ref 30–100)

## 2014-06-20 ENCOUNTER — Other Ambulatory Visit: Payer: Self-pay | Admitting: Gynecology

## 2014-06-20 DIAGNOSIS — E7800 Pure hypercholesterolemia, unspecified: Secondary | ICD-10-CM

## 2014-06-20 DIAGNOSIS — E786 Lipoprotein deficiency: Secondary | ICD-10-CM

## 2014-07-21 ENCOUNTER — Other Ambulatory Visit: Payer: Self-pay | Admitting: Gynecology

## 2014-07-21 DIAGNOSIS — Z1231 Encounter for screening mammogram for malignant neoplasm of breast: Secondary | ICD-10-CM

## 2014-07-27 ENCOUNTER — Ambulatory Visit (HOSPITAL_COMMUNITY)
Admission: RE | Admit: 2014-07-27 | Discharge: 2014-07-27 | Disposition: A | Discharge status: Home / Self Care | Payer: BLUE CROSS/BLUE SHIELD | Source: Ambulatory Visit | Attending: Gynecology | Admitting: Gynecology

## 2014-07-27 DIAGNOSIS — Z1231 Encounter for screening mammogram for malignant neoplasm of breast: Secondary | ICD-10-CM | POA: Diagnosis present

## 2015-04-21 ENCOUNTER — Encounter (HOSPITAL_COMMUNITY): Payer: Self-pay | Admitting: Emergency Medicine

## 2015-04-21 ENCOUNTER — Emergency Department (HOSPITAL_COMMUNITY): Payer: Managed Care, Other (non HMO)

## 2015-04-21 ENCOUNTER — Emergency Department (HOSPITAL_COMMUNITY)
Admission: EM | Admit: 2015-04-21 | Discharge: 2015-04-21 | Disposition: A | Discharge status: Home / Self Care | Payer: Managed Care, Other (non HMO) | Attending: Emergency Medicine | Admitting: Emergency Medicine

## 2015-04-21 DIAGNOSIS — Z7982 Long term (current) use of aspirin: Secondary | ICD-10-CM | POA: Diagnosis not present

## 2015-04-21 DIAGNOSIS — S01501A Unspecified open wound of lip, initial encounter: Secondary | ICD-10-CM | POA: Insufficient documentation

## 2015-04-21 DIAGNOSIS — Z86018 Personal history of other benign neoplasm: Secondary | ICD-10-CM | POA: Diagnosis not present

## 2015-04-21 DIAGNOSIS — Y998 Other external cause status: Secondary | ICD-10-CM | POA: Diagnosis not present

## 2015-04-21 DIAGNOSIS — S0990XA Unspecified injury of head, initial encounter: Secondary | ICD-10-CM | POA: Diagnosis not present

## 2015-04-21 DIAGNOSIS — R55 Syncope and collapse: Secondary | ICD-10-CM | POA: Diagnosis present

## 2015-04-21 DIAGNOSIS — Y9389 Activity, other specified: Secondary | ICD-10-CM | POA: Diagnosis not present

## 2015-04-21 DIAGNOSIS — W01198A Fall on same level from slipping, tripping and stumbling with subsequent striking against other object, initial encounter: Secondary | ICD-10-CM | POA: Insufficient documentation

## 2015-04-21 DIAGNOSIS — Z3202 Encounter for pregnancy test, result negative: Secondary | ICD-10-CM | POA: Diagnosis not present

## 2015-04-21 DIAGNOSIS — Y9289 Other specified places as the place of occurrence of the external cause: Secondary | ICD-10-CM | POA: Diagnosis not present

## 2015-04-21 DIAGNOSIS — R531 Weakness: Secondary | ICD-10-CM | POA: Diagnosis not present

## 2015-04-21 DIAGNOSIS — R4020 Unspecified coma: Secondary | ICD-10-CM

## 2015-04-21 LAB — COMPREHENSIVE METABOLIC PANEL
ALBUMIN: 3.8 g/dL (ref 3.5–5.0)
ALK PHOS: 52 U/L (ref 38–126)
ALT: 16 U/L (ref 14–54)
ANION GAP: 8 (ref 5–15)
AST: 18 U/L (ref 15–41)
BUN: 13 mg/dL (ref 6–20)
CALCIUM: 9.3 mg/dL (ref 8.9–10.3)
CO2: 27 mmol/L (ref 22–32)
Chloride: 105 mmol/L (ref 101–111)
Creatinine, Ser: 1.03 mg/dL — ABNORMAL HIGH (ref 0.44–1.00)
GFR calc non Af Amer: 58 mL/min — ABNORMAL LOW (ref >60)
GLUCOSE: 91 mg/dL (ref 65–99)
POTASSIUM: 3.9 mmol/L (ref 3.5–5.1)
SODIUM: 140 mmol/L (ref 135–145)
Total Bilirubin: 0.5 mg/dL (ref 0.3–1.2)
Total Protein: 7.3 g/dL (ref 6.5–8.1)

## 2015-04-21 LAB — I-STAT TROPONIN, ED: Troponin i, poc: 0 ng/mL (ref 0.00–0.08)

## 2015-04-21 LAB — DIFFERENTIAL
BASOS PCT: 0 %
Basophils Absolute: 0 10*3/uL (ref 0.0–0.1)
EOS ABS: 0.2 10*3/uL (ref 0.0–0.7)
EOS PCT: 3 %
Lymphocytes Relative: 31 %
Lymphs Abs: 2.1 10*3/uL (ref 0.7–4.0)
MONO ABS: 0.4 10*3/uL (ref 0.1–1.0)
Monocytes Relative: 6 %
NEUTROS PCT: 60 %
Neutro Abs: 4.2 10*3/uL (ref 1.7–7.7)

## 2015-04-21 LAB — CBC
HCT: 44.3 % (ref 36.0–46.0)
Hemoglobin: 14.5 g/dL (ref 12.0–15.0)
MCH: 28.1 pg (ref 26.0–34.0)
MCHC: 32.7 g/dL (ref 30.0–36.0)
MCV: 85.9 fL (ref 78.0–100.0)
PLATELETS: 199 10*3/uL (ref 150–400)
RBC: 5.16 MIL/uL — AB (ref 3.87–5.11)
RDW: 15 % (ref 11.5–15.5)
WBC: 6.9 10*3/uL (ref 4.0–10.5)

## 2015-04-21 LAB — PROTIME-INR
INR: 1.09 (ref 0.00–1.49)
PROTHROMBIN TIME: 14.3 s (ref 11.6–15.2)

## 2015-04-21 LAB — I-STAT BETA HCG BLOOD, ED (MC, WL, AP ONLY)

## 2015-04-21 LAB — APTT: aPTT: 30 seconds (ref 24–37)

## 2015-04-21 NOTE — ED Notes (Signed)
Today patient was talking on the phone this morning around 0900 when she lost consciousness and remembers waking up on the floor with a busted lip. States her vision was blurred intermittently throughout the day. Didn't come sooner d/t afraid to drive and didn't want to call the ambulance. Only pain is in busted upper lip. Family hx of strokes and MIs.

## 2015-04-21 NOTE — Discharge Instructions (Signed)
1. Medications: usual home medications 2. Treatment: rest, drink plenty of fluids 3. Follow Up: please followup with your primary doctor early next week for discussion of your diagnoses and further evaluation after today's visit; if you do not have a primary care doctor use the resource guide provided to find one; please return to the ER for loss of consciousness, chest pain, shortness of breath, new or worsening symptoms   Emergency Department Resource Guide 1) Find a Doctor and Pay Out of Pocket Although you won't have to find out who is covered by your insurance plan, it is a good idea to ask around and get recommendations. You will then need to call the office and see if the doctor you have chosen will accept you as a new patient and what types of options they offer for patients who are self-pay. Some doctors offer discounts or will set up payment plans for their patients who do not have insurance, but you will need to ask so you aren't surprised when you get to your appointment.  2) Contact Your Local Health Department Not all health departments have doctors that can see patients for sick visits, but many do, so it is worth a call to see if yours does. If you don't know where your local health department is, you can check in your phone book. The CDC also has a tool to help you locate your state's health department, and many state websites also have listings of all of their local health departments.  3) Find a [facility] If your illness is not likely to be very severe or complicated, you may want to try a walk in clinic. These are popping up all over the country in pharmacies, drugstores, and shopping centers. They're usually staffed by nurse practitioners or physician assistants that have been trained to treat common illnesses and complaints. They're usually fairly quick and inexpensive. However, if you have serious medical issues or chronic medical problems, these are probably not your best  option.  No Primary Care Doctor: - Call Health Connect at  [phone] - they can help you locate a primary care doctor that  accepts your insurance, provides certain services, etc. - Physician Referral Service- [phone]  Chronic Pain Problems: Organization         Address  Phone   Notes  [facility]  [phone] Patients need to be referred by their primary care doctor.   Medication Assistance: Organization         Address  Phone   Notes  [facility] Medication [facility] [address] [place], [place] [address] [phone] --Must be a resident of [facility] -- Must have NO insurance coverage whatsoever (no Medicaid/ Medicare, etc.) -- The pt. MUST have a primary care doctor that directs their care regularly and follows them in the community   [facility]ist  [phone]   [employer]  [phone]    Agencies that provide inexpensive medical care: Organization         Address  Phone   Notes  [place]  [phone]   [facility]    [phone]   [facility] [place], [place] [address] [phone]   [place] [phone]   Planned Parenthood    [phone]   [facility]    [phone]   [facility] ° [address], [place] Phone:  ([phone]) [phone], Fax:  [phone] Hours of Operation:  9 am - 6 pm, M-F.  Also accepts Medicaid/Medicare and self-pay.  °[facility] ° [address], [place] Phone: ([phone]) [phone], Fax: [phone]) [phone]. Hours of Operation:  8:30 am - 5:30 pm, M-F.  Also accepts Medicaid and self-pay.  °[facility] [place] [address], [place] Phone: ([phone]) [phone]   °[facility] [address], [place], [place] (336)[phone], Ext. 123 Mondays & Thursdays: 7-9 AM.  First 15  patients are seen on a first come, first serve basis. °  ° °Medicaid-accepting [place] Providers: ° °Organization         Address  Phone   Notes  °[facility] [address], [place] ([phone]) [phone] Also accepts self-pay patients.  °[facility] [address], [place] ° (336) [phone]   °[facility] [address], [place] (336) [phone]   °[facility] [address] [place] [address], [place] (336) [phone]   °Veita Bland [address], [place]  ° ([phone]) [phone] Only accepts [place] Access Medicaid patients after they have their name applied to their card.  ° °Self-Pay (no insurance) in [place]: ° °Organization         Address  Phone   Notes  °Sickle Cell Patients, [facility] [address], [place] ([phone]) [phone]   °[facility] [address], [place] (336) [phone]   °[facility] ° [address], [place] (336) [phone]   °[facility]/Dr. Osei-Bonsu ° [address] [place] [address], [place] or [address], [place] ([phone]) [phone] Phone number for both [place] and [place] locations is the same.  °[facility] [address], [place] ([phone]) [phone]   °[facility] [address] [place] [address], [place] or [address] (336) 852-7530 °(336) [phone]   °[facility] [address], [place] (336) 350-1642, phone; (336) [phone], fax Sees patients 1st and 3rd Saturday of every month.  Must not qualify for public or private insurance (i.e. Medicaid, Medicare, [place] Health Choice, Veterans' Benefits) • Household income should be no more than 200% of the poverty level •The clinic cannot treat you if you are pregnant or think you are pregnant • Sexually transmitted diseases are not treated at the clinic.  ° ° °Dental  Care: °Organization         Address  Phone  Notes  °[place] [facility] [address], [place] (336) [phone] Accepts children up to age 21 who are enrolled in Medicaid or [place] Health Choice; pregnant women with a Medicaid card; and children who have applied for Medicaid or [place] Health Choice, but were declined, whose parents can pay a reduced fee at time of service.  °[place] [facility] [place]  [address], [place] (336) 641-7733 Accepts children up to age 21 who are enrolled in Medicaid or [place] Health Choice; pregnant women with a Medicaid card; and children who have applied for Medicaid or  Health Choice, but were declined, whose parents can pay a reduced fee at time of service.  °[facility] ° [address], [place] (  [phone]) [phone] Patients are seen by appointment only. Walk-ins are not accepted. [employer] will see patients 71 years of age and older. Monday - Tuesday (8am-5pm) Most Wednesdays (8:30-5pm) $30 per visit, cash only  [facility]  [address], [facility] [phone] Patients are seen by appointment only. Walk-ins are not accepted. [place] will see patients 37 years of age and older. One Wednesday Evening (Monthly: Volunteer Based).  $30 per visit, cash only  [place]  [phone] for adults; Children under age 71, call Graduate Pediatric Dentistry at [phone]. Children aged 47-14, please call [phone] to request a pediatric application.  Dental services are provided in all areas of dental care including fillings, crowns and bridges, complete and partial dentures, implants, gum treatment, root canals, and extractions. Preventive care is also provided. Treatment is provided to both adults and children. Patients are selected via a lottery and there is often a waiting list.   [facility] [address] [place]  [phone] [email or web address]   [facility] [address] [place], [place] [phone], Ext. 123 Second and Fourth Thursday of each month, opens at 6:30 AM; Clinic ends at 9 AM.  Patients are seen on a first-come first-served basis, and a limited number are seen during each clinic.   [facility]  [address] [place], [place] [phone]   Eligibility Requirements You must have lived in [place], [place], or [place] counties for at least the last three months.   You cannot be eligible for state or federal sponsored [employer], including [employer], [place], or [employer].   You generally cannot be eligible for healthcare insurance through your employer.    How to apply: Eligibility screenings are held every Tuesday and Wednesday afternoon from 1:00 pm until 4:00 pm. You do not need an appointment for the interview!  [facility] [address] [place], [place]   [place]  [place] Department  [place]  [phone]    Behavioral Health Resources in the Community: Intensive Outpatient Programs Organization         Address  Phone  Notes  [facility] [address], [place], [place] [phone]   [facility] [address] [place], [place]   [facility] [address], [place], [place]   [place] [address]  [place], [place] or [phone]   Substance Abuse Resources Organization         Address  Phone  Notes  [facility]  205-726-1914   [place]  ([phone]) [phone]   [facility] [place]   Chinita Pester  [phone]   Residential & Outpatient Substance Abuse Program  (760) [phone]    Psychological Services Organization         Address  Phone  Notes  [facility] [place]  [place]  [phone]   [place] [address], [place] or (340)[phone]    Mobile Crisis Teams Organization         Address  Phone  Notes  [facility], Mobile Crisis Care Unit  [phone]   [facility]  [address] [place], [place]   [place] [address] °[place] 336-554-5454   ° °Self-Help/Support Groups °Organization         Address  Phone             Notes  °[facility] [place] - variety of support groups  336- [phone] Call for more information  °Narcotics Anonymous (NA), [facility] [address], °[place]  2 meetings at this location  ° °Residential Treatment Programs °Organization         Address  Phone  Notes  °[facility] [address],    °[place]  1-866-801-8205   °[facility] ° [address], [place], [place] [phone]   °[facility] [address], [place] [phone] Admissions: 8am-3pm M-F  °[facility] [address]    °[place], [place] [phone]   °[facility] [address], [place], [place] [phone]   °[facility] [address]  °[place], [place] 336-285-9073   °[facility] - Intensive Outpatient [address], [place], [place] 336-852-3033   °[facility] [address]  °[place], [place] 1-877-615-2722 or 336-784-9470   °[facility] ([facility]) [address] [place], [place] 336-227-7417 Accepts Medicaid  °[facility] [address]  ° [place] 1-800-659-3381 Substance Abuse/Addiction Treatment  ° °[place] Behavioral Health Resources °Organization         Address  Phone  Notes  °[facility]  (888) [phone]   °Julie Brannon, PhD [address] [place], [place]   (336) [phone] or (336) 951-0000   °[facility]   [address] °[place], [place] (336) 349-4454   °[facility] [address], [place], [place] (336) [phone] Insurance/Medicaid/sponsorship through [facility]  °[facility] [address]                                    [place], [place] (336) [phone] Therapy/tele-psych/case  °[facility] [address]  ° [place], [place] (336) [phone]    °Dr. Arfeen  (336) [phone]   °[facility] [place]  [employer] [place] [facility] 1) [address], [place] °2) [address], [place] °3)  [address], [place] (336) 349-3220 °(336) 342-7768 ° °(336) [phone]   °[place] Child Abuse Hotline (336) 342-1394 or (336) 342-3537 (After Hours)    ° ° ° ° °

## 2015-04-21 NOTE — ED Provider Notes (Signed)
CSN: [id number]     Arrival date & time 04/21/15  1711 History   First MD Initiated Contact with Patient 04/21/15 1854     Chief Complaint  Patient presents with  . Loss of Consciousness  . Blurred Vision  . Weakness  . Fall    HPI   Kathleen Novak is a 59 y.o. female with a PMH of uterine fibroid who presents to the ED with loss of consciousness. She states she was talking on the phone this morning around 9 AM, when she lost consciousness and fell. She states she hit her lip on the floor and subsequently woke up. She reports mild headache and dizziness, now resolved. She also reports intermittent blurred vision throughout the day today. She denies fever, chills, chest pain, shortness of breath, N/V, weakness. She reports paresthesia and numbness to her right medial arm earlier today, now resolved.   Past Medical History  Diagnosis Date  . Fibroid    Past Surgical History  Procedure Laterality Date  . Cesarean section  1984  . Tonsillectomy  1968   Family History  Problem Relation Age of Onset  . Diabetes Mother   . Hypertension Mother   . Stroke Mother   . Diabetes Father   . Heart disease Father   . Heart disease Brother   . Heart disease Maternal Grandmother   . Stroke Maternal Grandmother    Social History  Substance Use Topics  . Smoking status: Never Smoker   . Smokeless tobacco: Never Used  . Alcohol Use: No   OB History    Gravida Para Term Preterm AB TAB SAB Ectopic Multiple Living   1 1 1       1        Review of Systems  Constitutional: Negative for fever and chills.  Eyes: Positive for visual disturbance.  Respiratory: Negative for shortness of breath.   Cardiovascular: Negative for chest pain.  Gastrointestinal: Negative for nausea, vomiting, abdominal pain, diarrhea and constipation.  Musculoskeletal: Negative for myalgias, back pain, arthralgias and neck pain.  Neurological: Positive for dizziness, syncope, numbness and headaches. Negative for  weakness and light-headedness.  All other systems reviewed and are negative.     Allergies  Other  Home Medications   Prior to Admission medications   Medication Sig Start Date End Date Taking? Authorizing Provider  aspirin 81 MG tablet Take 81 mg by mouth daily.   Yes Historical Provider, MD    BP 104/68 mmHg  Pulse 58  Temp(Src) 98.7 F (37.1 C) (Oral)  Resp 16  SpO2 98% Physical Exam  Constitutional: She is oriented to person, place, and time. She appears well-developed and well-nourished. No distress.  HENT:  Head: Normocephalic.  Right Ear: External ear normal.  Left Ear: External ear normal.  Nose: Nose normal.  Mouth/Throat: Uvula is midline, oropharynx is clear and moist and mucous membranes are normal.  Superficial wound to right upper lip, hemostatic.  Eyes: Conjunctivae, EOM and lids are normal. Pupils are equal, round, and reactive to light. Right eye exhibits no discharge. Left eye exhibits no discharge. No scleral icterus.  Neck: Normal range of motion. Neck supple.  Cardiovascular: Normal rate, regular rhythm, normal heart sounds, intact distal pulses and normal pulses.   Pulmonary/Chest: Effort normal and breath sounds normal. No respiratory distress. She has no wheezes. She has no rales.  Abdominal: Soft. Normal appearance and bowel sounds are normal. She exhibits no distension and no mass. There is no tenderness. There is no  rigidity, no rebound and no guarding.  Musculoskeletal: Normal range of motion. She exhibits no edema or tenderness.  Neurological: She is alert and oriented to person, place, and time. She has normal strength. No cranial nerve deficit or sensory deficit. Coordination and gait normal. GCS eye subscore is 4. GCS verbal subscore is 5. GCS motor subscore is 6.  Skin: Skin is warm, dry and intact. No rash noted. She is not diaphoretic. No erythema. No pallor.  Psychiatric: She has a normal mood and affect. Her speech is normal and behavior is  normal.  Nursing note and vitals reviewed.   ED Course  Procedures (including critical care time)  Labs Review Labs Reviewed  CBC - Abnormal; Notable for the following:    RBC 5.16 (*)    All other components within normal limits  COMPREHENSIVE METABOLIC PANEL - Abnormal; Notable for the following:    Creatinine, Ser 1.03 (*)    GFR calc non Af Amer 58 (*)    All other components within normal limits  PROTIME-INR  APTT  DIFFERENTIAL  I-STAT TROPOININ, ED  I-STAT BETA HCG BLOOD, ED ([facility], [facility], [facility] ONLY)    Imaging Review Ct Head Wo Contrast  04/21/2015  CLINICAL DATA:  Patient loss consciousness and fell. Intermittent blurred vision EXAM: CT HEAD WITHOUT CONTRAST TECHNIQUE: Contiguous axial images were obtained from the base of the skull through the vertex without intravenous contrast. COMPARISON:  December 11, 2012 FINDINGS: The ventricles are normal in size and configuration. There is no intracranial mass, hemorrhage, extra-axial fluid collection, or midline shift. Gray-white compartments are normal. No acute infarct evident. There is basal ganglia calcification bilaterally, stable. The bony calvarium appears intact. The mastoid air cells are clear. No intraorbital lesions are identified. There is probable cerumen in the left external auditory canal. IMPRESSION: No intracranial mass, hemorrhage, or focal gray - white compartment lesions/acute appearing infarct. No extra-axial fluid collection identified. Basal ganglia calcification is likely physiologic in this age group. There is probable cerumen left external auditory canal. Electronically Signed   By: Lowella Grip III M.D.   On: 04/21/2015 19:45     I have personally reviewed and evaluated these images and lab results as part of my medical decision-making.   EKG Interpretation   Date/Time:  Friday April 21 2015 17:34:38 EST Ventricular Rate:  75 PR Interval:  171 QRS Duration: 86 QT Interval:  397 QTC Calculation: 443 R  Axis:   66 Text Interpretation:  Sinus rhythm Low voltage, precordial leads Confirmed  by Alvino Chapel  MD, Ovid Curd [phone]) on 04/21/2015 8:29:28 PM      MDM   Final diagnoses:  LOC (loss of consciousness)    59 year old female presents with LOC. States she was talking on the phone, when she lost consciousness, fell, and hit her lip on the floor. Reports mild headache and dizziness, now resolved. Also reports intermittent blurred vision throughout the day today. Denies fever, chills, chest pain, shortness of breath, N/V, weakness. Reports paresthesia and numbness to her right medial arm earlier today, now resolved.  Patient is afebrile. Vital signs stable. Superficial wound to right upper lip, hemostatic. Heart regular rate and rhythm. Lungs clear to auscultation bilaterally. Abdomen soft, nontender, nondistended. No lower extremity edema. Normal neuro exam with no focal deficit. Strength, sensation, coordination intact. Patient moves all extremities and ambulates without difficulty.  EKG sinus rhythm, heart rate 75. Troponin negative. CBC negative for leukocytosis or anemia. CMP unremarkable. INR and PTT within normal limits. Head CT negative  for intracranial mass, hemorrhage, or focal gray-white compartment lesions/acute appearing infarct.  HEART score 2 given age and positive family history. Patient low risk per [facility] Syncope Rule. Patient discussed with Dr. Alvino Chapel. Advised given no significant risk factors, patient appropriate for outpatient follow-up. Patient is nontoxic and well-appearing, feel she is stable for discharge at this time. Patient to follow-up with PCP. Return precautions discussed at length. Patient verbalizes her understanding and is in agreement with plan.  BP 104/68 mmHg  Pulse 58  Temp(Src) 98.7 F (37.1 C) (Oral)  Resp 16  SpO2 98%     Marella Chimes, PA-C 04/22/15 0115  Davonna Belling, MD 04/22/15 [phone]

## 2015-09-29 ENCOUNTER — Other Ambulatory Visit: Payer: Self-pay

## 2015-09-29 DIAGNOSIS — Z1231 Encounter for screening mammogram for malignant neoplasm of breast: Secondary | ICD-10-CM

## 2015-10-12 ENCOUNTER — Ambulatory Visit
Admission: RE | Admit: 2015-10-12 | Discharge: 2015-10-12 | Disposition: A | Discharge status: Home / Self Care | Payer: 59 | Source: Ambulatory Visit

## 2015-10-12 ENCOUNTER — Ambulatory Visit: Payer: BLUE CROSS/BLUE SHIELD

## 2015-10-12 DIAGNOSIS — Z1231 Encounter for screening mammogram for malignant neoplasm of breast: Secondary | ICD-10-CM

## 2016-06-14 DIAGNOSIS — Z01419 Encounter for gynecological examination (general) (routine) without abnormal findings: Secondary | ICD-10-CM | POA: Diagnosis not present

## 2016-06-28 DIAGNOSIS — Z130 Encounter for screening for diseases of the blood and blood-forming organs and certain disorders involving the immune mechanism: Secondary | ICD-10-CM | POA: Diagnosis not present

## 2016-06-28 DIAGNOSIS — Z0000 Encounter for general adult medical examination without abnormal findings: Secondary | ICD-10-CM | POA: Diagnosis not present

## 2016-06-28 DIAGNOSIS — Z13220 Encounter for screening for lipoid disorders: Secondary | ICD-10-CM | POA: Diagnosis not present

## 2016-06-28 DIAGNOSIS — Z131 Encounter for screening for diabetes mellitus: Secondary | ICD-10-CM | POA: Diagnosis not present

## 2016-06-28 DIAGNOSIS — Z1329 Encounter for screening for other suspected endocrine disorder: Secondary | ICD-10-CM | POA: Diagnosis not present

## 2016-07-25 DIAGNOSIS — R0683 Snoring: Secondary | ICD-10-CM | POA: Diagnosis not present

## 2016-07-25 DIAGNOSIS — Z833 Family history of diabetes mellitus: Secondary | ICD-10-CM | POA: Diagnosis not present

## 2016-07-25 DIAGNOSIS — R7303 Prediabetes: Secondary | ICD-10-CM | POA: Diagnosis not present

## 2016-10-28 DIAGNOSIS — M79631 Pain in right forearm: Secondary | ICD-10-CM | POA: Diagnosis not present

## 2016-10-28 DIAGNOSIS — L03113 Cellulitis of right upper limb: Secondary | ICD-10-CM | POA: Diagnosis not present

## 2016-12-27 ENCOUNTER — Other Ambulatory Visit: Payer: Self-pay | Admitting: Gynecology

## 2016-12-27 DIAGNOSIS — Z1231 Encounter for screening mammogram for malignant neoplasm of breast: Secondary | ICD-10-CM

## 2017-01-09 ENCOUNTER — Ambulatory Visit: Payer: 59

## 2017-01-17 ENCOUNTER — Ambulatory Visit
Admission: RE | Admit: 2017-01-17 | Discharge: 2017-01-17 | Disposition: A | Discharge status: Home / Self Care | Payer: 59 | Source: Ambulatory Visit | Attending: Gynecology | Admitting: Gynecology

## 2017-01-17 DIAGNOSIS — Z1231 Encounter for screening mammogram for malignant neoplasm of breast: Secondary | ICD-10-CM | POA: Diagnosis not present

## 2017-04-27 ENCOUNTER — Emergency Department (HOSPITAL_COMMUNITY)
Admission: EM | Admit: 2017-04-27 | Discharge: 2017-04-27 | Disposition: A | Discharge status: Home / Self Care | Payer: 59 | Attending: Emergency Medicine | Admitting: Emergency Medicine

## 2017-04-27 ENCOUNTER — Encounter (HOSPITAL_COMMUNITY): Payer: Self-pay | Admitting: Emergency Medicine

## 2017-04-27 DIAGNOSIS — R109 Unspecified abdominal pain: Secondary | ICD-10-CM

## 2017-04-27 DIAGNOSIS — R1084 Generalized abdominal pain: Secondary | ICD-10-CM | POA: Diagnosis not present

## 2017-04-27 DIAGNOSIS — R197 Diarrhea, unspecified: Secondary | ICD-10-CM | POA: Insufficient documentation

## 2017-04-27 DIAGNOSIS — R112 Nausea with vomiting, unspecified: Secondary | ICD-10-CM | POA: Diagnosis not present

## 2017-04-27 DIAGNOSIS — R1013 Epigastric pain: Secondary | ICD-10-CM | POA: Diagnosis not present

## 2017-04-27 LAB — COMPREHENSIVE METABOLIC PANEL
ALT: 16 U/L (ref 14–54)
ANION GAP: 6 (ref 5–15)
AST: 20 U/L (ref 15–41)
Albumin: 3.7 g/dL (ref 3.5–5.0)
Alkaline Phosphatase: 47 U/L (ref 38–126)
BUN: 11 mg/dL (ref 6–20)
CHLORIDE: 108 mmol/L (ref 101–111)
CO2: 26 mmol/L (ref 22–32)
Calcium: 8.9 mg/dL (ref 8.9–10.3)
Creatinine, Ser: 0.84 mg/dL (ref 0.44–1.00)
GFR calc non Af Amer: >60 mL/min (ref >60)
Glucose, Bld: 97 mg/dL (ref 65–99)
POTASSIUM: 3.4 mmol/L — AB (ref 3.5–5.1)
Sodium: 140 mmol/L (ref 135–145)
Total Bilirubin: 0.7 mg/dL (ref 0.3–1.2)
Total Protein: 7.1 g/dL (ref 6.5–8.1)

## 2017-04-27 LAB — CBC
HCT: 43.9 % (ref 36.0–46.0)
HEMOGLOBIN: 14.4 g/dL (ref 12.0–15.0)
MCH: 28.5 pg (ref 26.0–34.0)
MCHC: 32.8 g/dL (ref 30.0–36.0)
MCV: 86.9 fL (ref 78.0–100.0)
Platelets: 186 10*3/uL (ref 150–400)
RBC: 5.05 MIL/uL (ref 3.87–5.11)
RDW: 15 % (ref 11.5–15.5)
WBC: 5.4 10*3/uL (ref 4.0–10.5)

## 2017-04-27 LAB — LIPASE, BLOOD: LIPASE: 25 U/L (ref 11–51)

## 2017-04-27 MED ORDER — GI COCKTAIL ~~LOC~~
30.0000 mL | Freq: Once | ORAL | Status: AC
  Administered 2017-04-27: 30 mL via ORAL
  Filled 2017-04-27: qty 30

## 2017-04-27 NOTE — ED Triage Notes (Signed)
Patient reports that she ate cole slaw from [facility] on Friday then yesterday started having abd pains with n/v/d. Reports n/v gotten better but diarrhea still watery.

## 2017-04-27 NOTE — ED Notes (Signed)
Pt made aware of the need for UA. 

## 2017-04-27 NOTE — ED Provider Notes (Signed)
[facility] DEPT Provider Note  CSN: [id number] Arrival date & time: 04/27/17 1832  Chief Complaint(s) Abdominal Pain and Diarrhea  HPI Aston Lawhorn is a 61 y.o. female   The history is provided by the patient.  Abdominal Pain   This is a new problem. The current episode started 2 days ago. The pain is associated with suspicious food intake ([employer] cole slaw the day prior). The pain is located in the generalized abdominal region (migratory). The quality of the pain is colicky and cramping. The pain is moderate. Associated symptoms include diarrhea, nausea and vomiting (NBNB). Pertinent negatives include fever, hematochezia, melena, constipation and dysuria. The symptoms are aggravated by eating. Relieved by: BM.  Diarrhea   The stool consistency is described as watery. Associated symptoms include abdominal pain and vomiting (NBNB).   No recent travel or ABx. No sick contacts.  Past Medical History Past Medical History:  Diagnosis Date  . Fibroid    There are no active problems to display for this patient.  Home Medication(s) Prior to Admission medications   Medication Sig Start Date End Date Taking? Authorizing Provider  aspirin 81 MG tablet Take 81 mg by mouth daily.    [provider]                                                                                                                                    Past Surgical History Past Surgical History:  Procedure Laterality Date  . CESAREAN SECTION  1984  . TONSILLECTOMY  1968   Family History Family History  Problem Relation Age of Onset  . Diabetes Mother   . Hypertension Mother   . Stroke Mother   . Diabetes Father   . Heart disease Father   . Heart disease Brother   . Heart disease Maternal Grandmother   . Stroke Maternal Grandmother     Social History Social History   Tobacco Use  . Smoking status: Never Smoker  . Smokeless tobacco: Never Used  Substance  Use Topics  . Alcohol use: No    Alcohol/week: 0.0 oz  . Drug use: No   Allergies Other  Review of Systems Review of Systems  Constitutional: Negative for fever.  Gastrointestinal: Positive for abdominal pain, diarrhea, nausea and vomiting (NBNB). Negative for constipation, hematochezia and melena.  Genitourinary: Negative for dysuria.   All other systems are reviewed and are negative for acute change except as noted in the HPI  Physical Exam Vital Signs  I have reviewed the triage vital signs BP 98/82 (BP Location: Left Arm)   Pulse 76   Temp 98.2 F (36.8 C) (Oral)   Resp 20   SpO2 99%   Physical Exam  Constitutional: She is oriented to person, place, and time. She appears well-developed and well-nourished. No distress.  HENT:  Head: Normocephalic and atraumatic.  Nose: Nose normal.  Eyes: Conjunctivae and EOM are normal. Pupils  are equal, round, and reactive to light. Right eye exhibits no discharge. Left eye exhibits no discharge. No scleral icterus.  Neck: Normal range of motion. Neck supple.  Cardiovascular: Normal rate and regular rhythm. Exam reveals no gallop and no friction rub.  No murmur heard. Pulmonary/Chest: Effort normal and breath sounds normal. No stridor. No respiratory distress. She has no rales.  Abdominal: Soft. She exhibits no distension. There is tenderness (mild discomfort) in the epigastric area and left upper quadrant. There is no guarding and no CVA tenderness.  Musculoskeletal: She exhibits no edema or tenderness.  Neurological: She is alert and oriented to person, place, and time.  Skin: Skin is warm and dry. No rash noted. She is not diaphoretic. No erythema.  Psychiatric: She has a normal mood and affect.  Vitals reviewed.   ED Results and Treatments Labs (all labs ordered are listed, but only abnormal results are displayed) Labs Reviewed  COMPREHENSIVE METABOLIC PANEL - Abnormal; Notable for the following components:      Result Value     Potassium 3.4 (*)    All other components within normal limits  LIPASE, BLOOD  CBC                                                                                                                         EKG  EKG Interpretation  Date/Time:    Ventricular Rate:    PR Interval:    QRS Duration:   QT Interval:    QTC Calculation:   R Axis:     Text Interpretation:        Radiology No results found. Pertinent labs & imaging results that were available during my care of the patient were reviewed by me and considered in my medical decision making (see chart for details).  Medications Ordered in ED Medications  gi cocktail (Maalox,Lidocaine,Donnatal) (30 mLs Oral Given 04/27/17 2124)                                                                                                                                    Procedures Procedures  (including critical care time)  Medical Decision Making / ED Course I have reviewed the nursing notes for this encounter and the patient's prior records (if available in EHR or on provided paperwork).    61 y.o. female presents with vomiting, diarrhea for 2 days. Possible suspicious food intake. No oral  intolerance. Rest of history as above.  Patient appears well, not in distress, and with no signs of toxicity or dehydration. Abdomen with mild epigastric and LUQ discomfort but not peritonitic. Rest of the exam as above  Labs grossly reassuring without leukocytosis, significant electrolyte derangements, renal insufficiency.  No evidence of biliary obstruction or pancreatitis.  Most consistent with viral gastroenteritis vs food poisoning.  Doubt appendicitis, diverticulitis, severe colitis, dysentery.    Patient provided with GI cocktail resulting in complete resolution of patient's epigastric discomfort.  She was able to tolerate oral hydration. Will withhold Abx at this time.   Discussed symptomatic treatment with the patient and they will  follow closely with their PCP.   Final Clinical Impression(s) / ED Diagnoses Final diagnoses:  Nausea vomiting and diarrhea  Abdominal cramping   Disposition: Discharge  Condition: Good  I have discussed the results, Dx and Tx plan with the patient who expressed understanding and agree(s) with the plan. Discharge instructions discussed at great length. The patient was given strict return precautions who verbalized understanding of the instructions. No further questions at time of discharge.    ED Discharge Orders    None       Follow Up: [place], [address] [place] [address] [phone]  Schedule an appointment as soon as possible for a visit  As needed      This chart was dictated using voice recognition software.  Despite best efforts to proofread,  errors can occur which can change the documentation meaning.   Fatima Blank, MD 04/27/17 2231

## 2017-06-04 ENCOUNTER — Encounter: Payer: Self-pay | Admitting: Gastroenterology

## 2017-06-05 DIAGNOSIS — Z1211 Encounter for screening for malignant neoplasm of colon: Secondary | ICD-10-CM | POA: Diagnosis not present

## 2017-06-16 DIAGNOSIS — Z01419 Encounter for gynecological examination (general) (routine) without abnormal findings: Secondary | ICD-10-CM | POA: Diagnosis not present

## 2017-06-19 DIAGNOSIS — Z13220 Encounter for screening for lipoid disorders: Secondary | ICD-10-CM | POA: Diagnosis not present

## 2017-06-19 DIAGNOSIS — Z130 Encounter for screening for diseases of the blood and blood-forming organs and certain disorders involving the immune mechanism: Secondary | ICD-10-CM | POA: Diagnosis not present

## 2017-06-19 DIAGNOSIS — Z131 Encounter for screening for diabetes mellitus: Secondary | ICD-10-CM | POA: Diagnosis not present

## 2017-06-19 DIAGNOSIS — Z0000 Encounter for general adult medical examination without abnormal findings: Secondary | ICD-10-CM | POA: Diagnosis not present

## 2017-06-25 ENCOUNTER — Other Ambulatory Visit: Payer: Self-pay

## 2017-06-25 ENCOUNTER — Ambulatory Visit (AMBULATORY_SURGERY_CENTER): Payer: Self-pay | Admitting: *Deleted

## 2017-06-25 VITALS — Ht 63.0 in | Wt 189.8 lb

## 2017-06-25 DIAGNOSIS — Z1211 Encounter for screening for malignant neoplasm of colon: Secondary | ICD-10-CM

## 2017-06-25 MED ORDER — NA SULFATE-K SULFATE-MG SULF 17.5-3.13-1.6 GM/177ML PO SOLN: 1.0000 | Freq: Once | ORAL | 0 refills | Status: AC

## 2017-06-25 NOTE — Progress Notes (Signed)
No egg or soy allergy known to patient  No issues with past sedation with any surgeries  or procedures, no intubation problems  No diet pills per patient No home 02 use per patient  No blood thinners per patient  Pt denies issues with constipation  No A fib or A flutter  EMMI video sent to pt's e mail  Gave pt a 15.00 of suprep coupon in [place]

## 2017-06-27 ENCOUNTER — Encounter: Payer: Self-pay | Admitting: Gastroenterology

## 2017-07-02 DIAGNOSIS — R0681 Apnea, not elsewhere classified: Secondary | ICD-10-CM | POA: Diagnosis not present

## 2017-07-03 DIAGNOSIS — R251 Tremor, unspecified: Secondary | ICD-10-CM | POA: Diagnosis not present

## 2017-07-03 DIAGNOSIS — R2681 Unsteadiness on feet: Secondary | ICD-10-CM | POA: Diagnosis not present

## 2017-07-07 ENCOUNTER — Telehealth: Payer: Self-pay | Admitting: Gastroenterology

## 2017-07-07 NOTE — Telephone Encounter (Signed)
Ok no charge

## 2017-07-09 ENCOUNTER — Encounter: Payer: 59 | Admitting: Gastroenterology

## 2017-07-22 DIAGNOSIS — Z1211 Encounter for screening for malignant neoplasm of colon: Secondary | ICD-10-CM | POA: Diagnosis not present

## 2017-08-12 ENCOUNTER — Encounter: Payer: Self-pay | Admitting: Neurology

## 2017-08-12 ENCOUNTER — Telehealth: Payer: Self-pay | Admitting: Neurology

## 2017-08-12 ENCOUNTER — Ambulatory Visit: Payer: 59 | Admitting: Neurology

## 2017-08-12 VITALS — BP 114/66 | HR 66 | Ht 63.0 in | Wt 192.0 lb

## 2017-08-12 DIAGNOSIS — G250 Essential tremor: Secondary | ICD-10-CM | POA: Diagnosis not present

## 2017-08-12 DIAGNOSIS — R251 Tremor, unspecified: Secondary | ICD-10-CM

## 2017-08-12 DIAGNOSIS — R2689 Other abnormalities of gait and mobility: Secondary | ICD-10-CM | POA: Diagnosis not present

## 2017-08-12 NOTE — Progress Notes (Signed)
Subjective:    Patient ID: Kathleen Novak is a 62 y.o. female.  HPI     Star Age, MD, PhD [facility] [address], [address] [place], [place] [address]  Dear Dr. Jacelyn Grip,   I saw your patient, Kathleen Novak, upon your kind request in my neurologic clinic today for initial consultation of her tremor and gait disorder. The patient is unaccompanied today. As you know, [name] is a 62 year old right-handed woman with an underlying medical history of prediabetes, borderline obesity, and vitamin D deficiency, who reports tremors affecting both hands, left more than right. Symptoms started about a year ago. She denies a family history of tremors or Parkinson's disease. She has 3 brothers. She has had some gait unsteadiness, feels off-balance at times, thankfully no recent falls. I reviewed your office note from 07/03/2017, which you kindly included. She had a recent TSH through her GYN. She had a CTH wo contrast on 04/21/15, after a fall with LOC, and I reviewed the result: IMPRESSION: No intracranial mass, hemorrhage, or focal gray - white compartment lesions/acute appearing infarct. No extra-axial fluid collection identified. Basal ganglia calcification is likely physiologic in this age group. There is probable cerumen left external auditory canal.  She had a CTH wo contrast (for right sided numbness) on 12/31/12, and I reviewed the results: IMPRESSION: No acute intracranial process.  Normal head CT. She lives with her son area she has only one son. She has had some recent increase in stress at work. She feels that her tremor flares up when she is under stress. She has thought about cutting back at work, sometimes she feels depressed. She is a nonsmoker and does not typically utilize alcohol. She does not drink caffeine daily, maybe at the most 1 cup of coffee per day. She does not hydrate very well. She drinks on average 1-1/2 bottles of water per  day and one bottle of lemonade.     Her Past Medical History Is Significant For: Past Medical History:  Diagnosis Date  . Allergy   . Fibroid     Her Past Surgical History Is Significant For: Past Surgical History:  Procedure Laterality Date  . CESAREAN SECTION  1984  . TONSILLECTOMY  1968    Her Family History Is Significant For: Family History  Problem Relation Age of Onset  . Diabetes Mother   . Hypertension Mother   . Stroke Mother   . Diabetes Father   . Heart disease Father   . Heart disease Brother   . Heart disease Maternal Grandmother   . Stroke Maternal Grandmother   . Colon cancer Neg Hx   . Colon polyps Neg Hx   . Esophageal cancer Neg Hx   . Rectal cancer Neg Hx   . Stomach cancer Neg Hx     Her Social History Is Significant For: Social History   Socioeconomic History  . Marital status: Single    Spouse name: Not on file  . Number of children: Not on file  . Years of education: Not on file  . Highest education level: Not on file  Occupational History  . Not on file  Social Needs  . Financial resource strain: Not on file  . Food insecurity:    Worry: Not on file    Inability: Not on file  . Transportation needs:    Medical: Not on file    Non-medical: Not on file  Tobacco Use  . Smoking status: Never Smoker  .  Smokeless tobacco: Never Used  Substance and Sexual Activity  . Alcohol use: No    Alcohol/week: 0.0 oz  . Drug use: No  . Sexual activity: Never    Birth control/protection: Post-menopausal    Comment: 1st intercourse 62 yo--Fewer than 5 partners  Lifestyle  . Physical activity:    Days per week: Not on file    Minutes per session: Not on file  . Stress: Not on file  Relationships  . Social connections:    Talks on phone: Not on file    Gets together: Not on file    Attends religious service: Not on file    Active member of club or organization: Not on file    Attends meetings of clubs or organizations: Not on file     Relationship status: Not on file  Other Topics Concern  . Not on file  Social History Narrative  . Not on file    Her Allergies Are:  Allergies  Allergen Reactions  . Other     Walnuts and brazalian nuts   :   Her Current Medications Are:  Outpatient Encounter Medications as of 08/12/2017  Medication Sig  . aspirin 81 MG tablet Take 81 mg by mouth daily.  . cholecalciferol (VITAMIN D) 1000 units tablet Take 1,000 Units by mouth daily.   No facility-administered encounter medications on file as of 08/12/2017.   :   Review of Systems:  Out of a complete 14 point review of systems, all are reviewed and negative with the exception of these symptoms as listed below:  Review of Systems  Neurological:       Pt presents today to discuss her tremors. Pt notices tremors in her hands and head, mostly in her left hand, even though she is right handed. Pt also reports feeling off balance and has almost tripped down a set of stairs.    Objective:  Neurological Exam  Physical Exam Physical Examination:   Vitals:   08/12/17 0950  BP: 114/66  Pulse: 66   General Examination: The patient is a very pleasant 62 y.o. female in no acute distress. She appears well-developed and well-nourished and well groomed.   HEENT: Normocephalic, atraumatic, pupils are equal, round and reactive to light and accommodation. Corrective eye glasses in place. Extraocular tracking is good without limitation to gaze excursion or nystagmus noted. Normal smooth pursuit is noted. Hearing is grossly intact. Face is symmetric with normal facial animation and normal facial sensation. Speech is clear with no dysarthria noted. There is no hypophonia. There is a very mild intermittent head tremor, slight voice tremor. Neck is supple with full range of passive and active motion. There are no carotid bruits on auscultation. Oropharynx exam reveals: moderate mouth dryness, adequate dental hygiene. Tongue protrudes centrally and  palate elevates symmetrically.    Chest: Clear to auscultation without wheezing, rhonchi or crackles noted.  Heart: S1+S2+0, regular and normal without murmurs, rubs or gallops noted.   Abdomen: Soft, non-tender and non-distended with normal bowel sounds appreciated on auscultation.  Extremities: There is no pitting edema in the distal lower extremities bilaterally. Pedal pulses are intact.  Skin: Warm and dry without trophic changes noted.  Musculoskeletal: exam reveals no obvious joint deformities, tenderness or joint swelling or erythema.   Neurologically:  Mental status: The patient is awake, alert and oriented in all 4 spheres. Her immediate and remote memory, attention, language skills and fund of knowledge are appropriate. There is no evidence of aphasia, agnosia, apraxia  or anomia. Speech is clear with normal prosody and enunciation. Thought process is linear. Mood is normal and affect is normal.  Cranial nerves II - XII are as described above under HEENT exam. In addition: shoulder shrug is normal with equal shoulder height noted. Motor exam: Normal bulk, strength and tone is noted. There is no drift, resting tremor or rebound. She has a slight postural tremor in the right more than left upper extremity, no action tremor, no intention tremor.   On 08/12/2017: on Archimedes spiral drawing she has erratic trembling noted with the right hand, course difficulty with the left, handwriting is legible, not particularly tremulous, not micrographic.  Romberg is negative. Reflexes are 1-2+ throughout. Babinski: Toes are flexor bilaterally. Fine motor skills and coordination: intact with normal finger taps, normal hand movements, normal rapid alternating patting, normal foot taps and normal foot agility.  Cerebellar testing: No dysmetria or intention tremor on finger to nose testing. Heel to shin is unremarkable bilaterally. There is no truncal or gait ataxia.  Sensory exam: intact to light touch,  vibration, temp in the upper and lower extremities.  Gait, station and balance: She stands easily. No veering to one side is noted. No leaning to one side is noted. Posture is age-appropriate and stance is narrow based. Gait shows normal stride length and normal pace. No problems turning are noted. Tandem walk is slow, but doable.   Assessment and Plan:    In summary, Kalea Perine is a very pleasant 62 y.o.-year old female with an Underlying benign medical history with the exception of vitamin D deficiency, who presents for neurologic consultation of her tremor. On examination, she has a very mild head and voice tremor, slight hand tremor which is primarily with posture. Exam otherwise is benign and negative for parkinsonism him a she is reassured in that regard. Her history is not telltale for essential tremor. Nevertheless, she may have a very mild form of essential tremor. She does report increased stress recently which has at times exacerbated her tremor. She is advised regarding typical tremor triggers including dehydration, stress, anxiety, sleep deprivation. She is advised to pursue healthy lifestyle and enough rest at night, with a goal of 7-8 hours of sleep. Furthermore, she is advised to stay better hydrated with water. She is encouraged to talk to about stress management, management of anxiety and depression as well. Symptomatic treatment for tremor can be considered but is typically more difficult to achieve for head tremor as opposed to hand tremors. She is advised to monitor her symptoms.down the road, we may consider symptomatic treatment with Mysoline/primidone or a beta blocker but I would like to hold off at this point. She is advised to proceed with a brain MRI with and without contrast. We will call her with her test results. For the most part, patient is reassured today. I will see her back on an as-needed basis, so long as her brain scan is reassuring and age-appropriate. I answered  all her questions today and the patient was in agreement with the above outlined plan. Thank you very much for allowing me to participate in the care of this nice patient. If I can be of any further assistance to you please do not hesitate to call me at [phone].  Sincerely,   Star Age, MD, PhD

## 2017-08-12 NOTE — Patient Instructions (Addendum)
I don't see any signs of parkinsonism or Parkinson's disease, which is reassuring.  You have a mild head and voice tremor, minimal in your hands.  I would not recommend any symptomatic treatment for your tremor at this point.  As discussed, we will do a brain scan, called MRI and call you with the test results. We will have to schedule you for this on a separate date. This test requires authorization from your insurance, and we will take care of the insurance process. Please talk to your primary Care physician about ways to reduce stress and management of anxiety and depression.  Please try to hydrate better with water. 6-8 cups of water per day would be ideal. I will see you back as needed.

## 2017-08-12 NOTE — Telephone Encounter (Signed)
pending faxed clinical notes case # [id number].

## 2017-08-13 ENCOUNTER — Telehealth: Payer: Self-pay | Admitting: Neurology

## 2017-08-13 NOTE — Telephone Encounter (Signed)
[employer] Auth: [id number] (exp. 08/12/17 to 09/26/17)   lvm for pt to call back about scheduling mri

## 2018-07-15 ENCOUNTER — Ambulatory Visit (HOSPITAL_COMMUNITY)
Admission: EM | Admit: 2018-07-15 | Discharge: 2018-07-15 | Discharge status: Against Medical Advice | Payer: 59 | Source: Home / Self Care

## 2018-07-15 ENCOUNTER — Other Ambulatory Visit: Payer: Self-pay

## 2018-07-15 ENCOUNTER — Emergency Department (HOSPITAL_COMMUNITY): Payer: 59

## 2018-07-15 ENCOUNTER — Emergency Department (HOSPITAL_COMMUNITY)
Admission: EM | Admit: 2018-07-15 | Discharge: 2018-07-15 | Disposition: A | Discharge status: Home / Self Care | Payer: 59 | Attending: Emergency Medicine | Admitting: Emergency Medicine

## 2018-07-15 ENCOUNTER — Encounter (HOSPITAL_COMMUNITY): Payer: Self-pay | Admitting: Emergency Medicine

## 2018-07-15 DIAGNOSIS — R079 Chest pain, unspecified: Secondary | ICD-10-CM | POA: Diagnosis not present

## 2018-07-15 DIAGNOSIS — Z79899 Other long term (current) drug therapy: Secondary | ICD-10-CM | POA: Insufficient documentation

## 2018-07-15 DIAGNOSIS — R0602 Shortness of breath: Secondary | ICD-10-CM | POA: Diagnosis not present

## 2018-07-15 DIAGNOSIS — M79602 Pain in left arm: Secondary | ICD-10-CM | POA: Diagnosis not present

## 2018-07-15 DIAGNOSIS — Z7982 Long term (current) use of aspirin: Secondary | ICD-10-CM | POA: Insufficient documentation

## 2018-07-15 LAB — CBC
HCT: 42.8 % (ref 36.0–46.0)
Hemoglobin: 13.6 g/dL (ref 12.0–15.0)
MCH: 27.9 pg (ref 26.0–34.0)
MCHC: 31.8 g/dL (ref 30.0–36.0)
MCV: 87.7 fL (ref 80.0–100.0)
Platelets: 196 10*3/uL (ref 150–400)
RBC: 4.88 MIL/uL (ref 3.87–5.11)
RDW: 14.5 % (ref 11.5–15.5)
WBC: 6.3 10*3/uL (ref 4.0–10.5)
nRBC: 0 % (ref 0.0–0.2)

## 2018-07-15 LAB — BASIC METABOLIC PANEL
ANION GAP: 8 (ref 5–15)
BUN: 16 mg/dL (ref 8–23)
CO2: 23 mmol/L (ref 22–32)
Calcium: 8.8 mg/dL — ABNORMAL LOW (ref 8.9–10.3)
Chloride: 108 mmol/L (ref 98–111)
Creatinine, Ser: 0.89 mg/dL (ref 0.44–1.00)
GFR calc non Af Amer: >60 mL/min (ref >60)
Glucose, Bld: 106 mg/dL — ABNORMAL HIGH (ref 70–99)
Potassium: 3.9 mmol/L (ref 3.5–5.1)
Sodium: 139 mmol/L (ref 135–145)

## 2018-07-15 LAB — I-STAT TROPONIN, ED
TROPONIN I, POC: 0 ng/mL (ref 0.00–0.08)
Troponin i, poc: 0 ng/mL (ref 0.00–0.08)

## 2018-07-15 MED ORDER — SODIUM CHLORIDE 0.9% FLUSH: 3.0000 mL | Freq: Once | INTRAVENOUS | Status: DC

## 2018-07-15 NOTE — ED Notes (Signed)
Pt c/o L arm achey feeling, states she wants a heart work up due to family history, told patient we do not do heart work ups here, offered to do EKG prior to going to ER, pt refused, states she would head striaght to ER and have it done there.

## 2018-07-15 NOTE — ED Provider Notes (Signed)
[facility] EMERGENCY DEPARTMENT Provider Note   CSN: [id number] Arrival date & time: 07/15/18  1600    History   Chief Complaint Chief Complaint  Patient presents with  . Chest Pain    HPI Kathleen Novak is a 63 y.o. female.     Patient is a 63 year old female with past medical history of fibroids.  She presents today with complaints of shortness of breath and discomfort in her left arm.  She states that she has been experiencing shortness of breath with exertion for the past 6 months.  Over the past several months she has been having heaviness in her left arm that worsened earlier today.  She describes an "ache" in her left upper arm and a small twinge of discomfort in her chest.  She denies any shortness of breath, diaphoresis, or nausea.  She does have a family history with her brother and father both having had bypass surgery.  The history is provided by the patient.  Chest Pain  Chest pain location: Left arm. Pain quality: aching   Pain radiates to:  Does not radiate Pain severity:  Mild Timing:  Intermittent Progression:  Worsening Chronicity:  New Worsened by:  Nothing Ineffective treatments:  None tried   Past Medical History:  Diagnosis Date  . Allergy   . Fibroid     There are no active problems to display for this patient.   Past Surgical History:  Procedure Laterality Date  . CESAREAN SECTION  1984  . TONSILLECTOMY  1968     OB History    Gravida  1   Para  1   Term  1   Preterm      AB      Living  1     SAB      TAB      Ectopic      Multiple      Live Births               Home Medications    Prior to Admission medications   Medication Sig Start Date End Date Taking? Authorizing Provider  aspirin 81 MG tablet Take 81 mg by mouth daily.    [provider]  cholecalciferol (VITAMIN D) 1000 units tablet Take 1,000 Units by mouth daily.    [provider]    Family  History Family History  Problem Relation Age of Onset  . Diabetes Mother   . Hypertension Mother   . Stroke Mother   . Diabetes Father   . Heart disease Father   . Heart disease Brother   . Heart disease Maternal Grandmother   . Stroke Maternal Grandmother   . Colon cancer Neg Hx   . Colon polyps Neg Hx   . Esophageal cancer Neg Hx   . Rectal cancer Neg Hx   . Stomach cancer Neg Hx     Social History Social History   Tobacco Use  . Smoking status: Never Smoker  . Smokeless tobacco: Never Used  Substance Use Topics  . Alcohol use: No    Alcohol/week: 0.0 standard drinks  . Drug use: No     Allergies   Other   Review of Systems Review of Systems  Cardiovascular: Positive for chest pain.  All other systems reviewed and are negative.    Physical Exam Updated Vital Signs BP 114/72 (BP Location: Left Arm)   Pulse 69   Temp 98.2 F (36.8 C) (Oral)   Resp 17  SpO2 97%   Physical Exam Vitals signs and nursing note reviewed.  Constitutional:      General: She is not in acute distress.    Appearance: She is well-developed. She is not diaphoretic.  HENT:     Head: Normocephalic and atraumatic.  Neck:     Musculoskeletal: Normal range of motion and neck supple.  Cardiovascular:     Rate and Rhythm: Normal rate and regular rhythm.     Heart sounds: No murmur. No friction rub. No gallop.   Pulmonary:     Effort: Pulmonary effort is normal. No respiratory distress.     Breath sounds: Normal breath sounds. No wheezing.  Abdominal:     General: Bowel sounds are normal. There is no distension.     Palpations: Abdomen is soft.     Tenderness: There is no abdominal tenderness.  Musculoskeletal: Normal range of motion.     Right lower leg: She exhibits no tenderness. No edema.     Left lower leg: She exhibits no tenderness. No edema.  Skin:    General: Skin is warm and dry.  Neurological:     Mental Status: She is alert and oriented to person, place, and time.       ED Treatments / Results  Labs (all labs ordered are listed, but only abnormal results are displayed) Labs Reviewed  BASIC METABOLIC PANEL - Abnormal; Notable for the following components:      Result Value   Glucose, Bld 106 (*)    Calcium 8.8 (*)    All other components within normal limits  CBC  I-STAT TROPONIN, ED  I-STAT TROPONIN, ED    EKG None  Radiology Dg Chest 2 View  Result Date: 07/15/2018 CLINICAL DATA:  Mid chest pain 1-2 days. EXAM: CHEST - 2 VIEW COMPARISON:  None. FINDINGS: The heart size and mediastinal contours are within normal limits. Both lungs are clear. The visualized skeletal structures are unremarkable. IMPRESSION: No active cardiopulmonary disease. Electronically Signed   By: Ashley Royalty M.D.   On: 07/15/2018 17:11    Procedures Procedures (including critical care time)  Medications Ordered in ED Medications  sodium chloride flush (NS) 0.9 % injection 3 mL (has no administration in time range)     Initial Impression / Assessment and Plan / ED Course  I have reviewed the triage vital signs and the nursing notes.  Pertinent labs & imaging results that were available during my care of the patient were reviewed by me and considered in my medical decision making (see chart for details).  Patient is a 63 year old female presenting with complaints of an achy feeling to her left arm since yesterday.  She describes mild discomfort in the front of her chest.  This was unrelated to exertion.  Work-up today reveals negative troponin x2 and unchanged EKG.  Discussion regarding the disposition with the patient has resulted in the decision to allow her to go home and follow-up with cardiology as an outpatient.  She will be given the contact information for the [facility] cardiology clinic with whom she can call and make a follow-up appointment.  She is to return in the meantime if her symptoms worsen.  Final Clinical Impressions(s) / ED Diagnoses    Final diagnoses:  None    ED Discharge Orders    None       Veryl Speak, MD 07/15/18 2212

## 2018-07-15 NOTE — Discharge Instructions (Signed)
Follow-up with the [facility] cardiology clinic in the next few days.  Their contact information has been provided in this discharge summary for you to call and make these arrangements.

## 2018-07-15 NOTE — ED Notes (Signed)
Patient verbalizes understanding of discharge instructions. Opportunity for questioning and answers were provided. Armband removed by staff, pt discharged from ED ambulatory.   

## 2018-07-15 NOTE — ED Triage Notes (Signed)
Pt c/o chest ache and left arm pain x 1 day, worsening last night. Denies shortness of breath/nausea/vomiting. Endorses lightheadedness with ambulation.

## 2018-08-27 DIAGNOSIS — R2681 Unsteadiness on feet: Secondary | ICD-10-CM | POA: Diagnosis not present

## 2018-08-27 DIAGNOSIS — R251 Tremor, unspecified: Secondary | ICD-10-CM | POA: Diagnosis not present

## 2018-08-27 DIAGNOSIS — R5383 Other fatigue: Secondary | ICD-10-CM | POA: Diagnosis not present

## 2018-09-02 ENCOUNTER — Telehealth: Payer: Self-pay

## 2018-09-02 NOTE — Telephone Encounter (Signed)
   Cardiac Questionnaire:    Since your last visit or hospitalization:    1. Have you been having new or worsening chest pain? No, just sharp pain in left arm toward shoulder and radiates to back   2. Have you been having new or worsening shortness of breath? No  3. Have you been having new or worsening leg swelling, wt gain, or increase in abdominal girth (pants fitting more tightly)? Yes, Ankle swelling   4. Have you had any passing out spells?  No    *A YES to any of these questions would result in the appointment being kept. *If all the answers to these questions are NO, we should indicate that given the current situation regarding the worldwide coronarvirus pandemic, at the recommendation of the CDC, we are looking to limit gatherings in our waiting area, and thus will reschedule their appointment beyond four weeks from today.   _____________   LHTDS-28 Pre-Screening Questions:  . Do you currently have a fever? No . Have you recently travelled on a cruise, internationally, or to [place], [place], [place], [place], [place], or [place], [place] [employer]) ? No  . Have you been in contact with someone that is currently pending confirmation of Covid19 testing or has been confirmed to have the [place] virus? No Are you currently experiencing fatigue or cough? No

## 2018-09-03 NOTE — Progress Notes (Signed)
CARDIOLOGY CONSULT NOTE       Patient ID: Kathleen Novak MRN: [id number] DOB/AGE: 1955-10-12 63 y.o.  Admit date: (Not on file) Referring Physician: [name],[name]  Primary Physician: [name], MD Primary Cardiologist: [name] Reason for Consultation: Chest pain  Active Problems:   * No active hospital problems. *  Referred by [name] for chest pain  HPI:  63 y.o. with family history of premature CAD. Seen in ER for chest pain 07/15/18.  Complaints of shortness of breath and discomfort in her left arm.  She states that she has been experiencing shortness of breath with exertion for the past 6 months.  Over the past several months she has been having heaviness in her left arm that worsened precipitating ER visit She describes an "ache" in her left upper arm and a small twinge of discomfort in her chest She r/o CXR no active disease no cardiac enlargement normal mediastinum ECG 07/16/18 NSR rate 78 no acute changes poor R wave progression cannot r/o previous anterior MI.     Saw primary 09/06/18 tremor in both hands , unsteady gait and fatigue   Since ER just muscular left arm pain and exertional dyspnea   Use to travel a lot for work and notes more nasal congestion and vertigo  ROS All other systems reviewed and negative except as noted above  Past Medical History:  Diagnosis Date  . Allergy   . Chest pain   . Fibroid     Family History  Problem Relation Age of Onset  . Diabetes Mother   . Hypertension Mother   . Stroke Mother   . Diabetes Father   . Heart disease Father   . Heart disease Brother   . Heart disease Maternal Grandmother   . Stroke Maternal Grandmother   . Colon cancer Neg Hx   . Colon polyps Neg Hx   . Esophageal cancer Neg Hx   . Rectal cancer Neg Hx   . Stomach cancer Neg Hx     Social History   Socioeconomic History  . Marital status: Single    Spouse name: Not on file  . Number of children: Not on file  . Years of education: Not on  file  . Highest education level: Not on file  Occupational History  . Not on file  Social Needs  . Financial resource strain: Not on file  . Food insecurity:    Worry: Not on file    Inability: Not on file  . Transportation needs:    Medical: Not on file    Non-medical: Not on file  Tobacco Use  . Smoking status: Never Smoker  . Smokeless tobacco: Never Used  Substance and Sexual Activity  . Alcohol use: No    Alcohol/week: 0.0 standard drinks  . Drug use: No  . Sexual activity: Never    Birth control/protection: Post-menopausal    Comment: 1st intercourse 63 yo--Fewer than 5 partners  Lifestyle  . Physical activity:    Days per week: Not on file    Minutes per session: Not on file  . Stress: Not on file  Relationships  . Social connections:    Talks on phone: Not on file    Gets together: Not on file    Attends religious service: Not on file    Active member of club or organization: Not on file    Attends meetings of clubs or organizations: Not on file    Relationship status: Not on file  .  Intimate partner violence:    Fear of current or ex partner: Not on file    Emotionally abused: Not on file    Physically abused: Not on file    Forced sexual activity: Not on file  Other Topics Concern  . Not on file  Social History Narrative  . Not on file    Past Surgical History:  Procedure Laterality Date  . CESAREAN SECTION  1984  . TONSILLECTOMY  1968        Physical Exam: Blood pressure 114/72, pulse 70, height 5\' 3"  (1.6 m), weight 91.9 kg, SpO2 95 %.   Affect appropriate Healthy:  appears stated age 33: normal Neck supple with no adenopathy JVP normal no bruits no thyromegaly Lungs clear with no wheezing and good diaphragmatic motion Heart:  S1/S2 no murmur, no rub, gallop or click PMI normal Abdomen: benighn, BS positve, no tenderness, no AAA no bruit.  No HSM or HJR Distal pulses intact with no bruits No edema Neuro non-focal Skin warm and dry  No muscular weakness   Labs:   Lab Results  Component Value Date   WBC 6.3 07/15/2018   HGB 13.6 07/15/2018   HCT 42.8 07/15/2018   MCV 87.7 07/15/2018   PLT 196 07/15/2018   No results for input(s): NA, K, CL, CO2, BUN, CREATININE, CALCIUM, PROT, BILITOT, ALKPHOS, ALT, AST, GLUCOSE in the last 168 hours.  Invalid input(s): LABALBU No results found for: CKTOTAL, CKMB, CKMBINDEX, TROPONINI  Lab Results  Component Value Date   CHOL 168 06/17/2014   CHOL 179 05/03/2013   CHOL 157 04/30/2012   Lab Results  Component Value Date   HDL 34 (L) 06/17/2014   HDL 32 (L) 05/03/2013   HDL 35 (L) 04/30/2012   Lab Results  Component Value Date   LDLCALC 113 (H) 06/17/2014   LDLCALC 129 (H) 05/03/2013   LDLCALC 102 (H) 04/30/2012   Lab Results  Component Value Date   TRIG 103 06/17/2014   TRIG 90 05/03/2013   TRIG 99 04/30/2012   Lab Results  Component Value Date   CHOLHDL 4.9 06/17/2014   CHOLHDL 5.6 05/03/2013   CHOLHDL 4.5 04/30/2012   No results found for: LDLDIRECT    Radiology: No results found.  EKG: See HPI   ASSESSMENT AND PLAN:   1. Chest Pain ; atypical R/o abnormal ECG will order f/u lexiscan myovue and echo for RWMA;s and EF. Studies will be done when COVID 19 restrictions lifted she appears to be low risk  2. Dyspnea: normal exam doubt cardiac etiology f/u echo for EF especially since ECG has poor R wave progression r/o old silent anterior MI 3. Tremor:  Mild doubt Parkinsons f/u neurology  Signed: [name] 09/04/2018, 2:33 PM

## 2018-09-04 ENCOUNTER — Encounter: Payer: Self-pay | Admitting: Cardiovascular Disease

## 2018-09-04 ENCOUNTER — Other Ambulatory Visit: Payer: Self-pay

## 2018-09-04 ENCOUNTER — Ambulatory Visit: Payer: 59 | Admitting: Cardiovascular Disease

## 2018-09-04 VITALS — BP 114/72 | HR 70 | Ht 63.0 in | Wt 202.6 lb

## 2018-09-04 DIAGNOSIS — R0789 Other chest pain: Secondary | ICD-10-CM

## 2018-09-04 DIAGNOSIS — R0600 Dyspnea, unspecified: Secondary | ICD-10-CM

## 2018-09-04 DIAGNOSIS — R079 Chest pain, unspecified: Secondary | ICD-10-CM

## 2018-09-04 NOTE — Patient Instructions (Addendum)
Medication Instructions:   If you need a refill on your cardiac medications before your next appointment, please call your pharmacy.   Lab work:  If you have labs (blood work) drawn today and your tests are completely normal, you will receive your results only by: Marland Kitchen MyChart Message (if you have MyChart) OR . A paper copy in the mail If you have any lab test that is abnormal or we need to change your treatment, we will call you to review the results.  Testing/Procedures: Your physician has requested that you have an echocardiogram in 8 weeks. Echocardiography is a painless test that uses sound waves to create images of your heart. It provides your doctor with information about the size and shape of your heart and how well your heart's chambers and valves are working. This procedure takes approximately one hour. There are no restrictions for this procedure.  Your physician has requested that you have a lexiscan myoview in 8 weeks. For further information please visit [email or web address]. Please follow instruction sheet, as given.  Follow-Up: At [facility], you and your health needs are our priority.  As part of our continuing mission to provide you with exceptional heart care, we have created designated Provider Care Teams.  These Care Teams include your primary Cardiologist (physician) and Advanced Practice Providers (APPs -  Physician Assistants and Nurse Practitioners) who all work together to provide you with the care you need, when you need it. . You will need a follow up appointment as needed with Dr. Johnsie Cancel.

## 2018-09-19 DIAGNOSIS — R42 Dizziness and giddiness: Secondary | ICD-10-CM | POA: Diagnosis not present

## 2018-09-19 DIAGNOSIS — M545 Low back pain: Secondary | ICD-10-CM | POA: Diagnosis not present

## 2018-09-24 ENCOUNTER — Telehealth: Payer: Self-pay | Admitting: Cardiovascular Disease

## 2018-09-24 NOTE — Telephone Encounter (Signed)
New Message   Patient states someone called her but phone was breaking up.  She ask to be called back.

## 2018-09-24 NOTE — Telephone Encounter (Signed)
Pt called back returning Pam's call.  She said to call her on an alternate number [phone].

## 2018-09-25 ENCOUNTER — Telehealth (HOSPITAL_COMMUNITY): Payer: Self-pay | Admitting: *Deleted

## 2018-09-25 NOTE — Telephone Encounter (Signed)
Patient given detailed instructions per Myocardial Perfusion Study Information Sheet for the test on 09/29/18 at 10:45. Patient notified to arrive 15 minutes early and that it is imperative to arrive on time for appointment to keep from having the test rescheduled.  If you need to cancel or reschedule your appointment, please call the office within 24 hours of your appointment. . Patient verbalized understanding.Veronia Beets

## 2018-09-25 NOTE — Telephone Encounter (Signed)
I think scheduling was trying to contact patient for echo and lexiscan appt. Will forward to them, to make sure.

## 2018-09-29 ENCOUNTER — Telehealth (HOSPITAL_COMMUNITY): Payer: Self-pay | Admitting: *Deleted

## 2018-09-29 ENCOUNTER — Other Ambulatory Visit: Payer: Self-pay

## 2018-09-29 ENCOUNTER — Ambulatory Visit (HOSPITAL_COMMUNITY): Payer: 59 | Attending: Cardiology

## 2018-09-29 DIAGNOSIS — R0789 Other chest pain: Secondary | ICD-10-CM

## 2018-09-29 DIAGNOSIS — R0600 Dyspnea, unspecified: Secondary | ICD-10-CM

## 2018-09-29 DIAGNOSIS — R079 Chest pain, unspecified: Secondary | ICD-10-CM

## 2018-09-29 LAB — MYOCARDIAL PERFUSION IMAGING
LV dias vol: 55 mL (ref 46–106)
LV sys vol: 18 mL
Peak HR: 94 {beats}/min
RATE: 1
Rest HR: 58 {beats}/min
SRS: 1
SSS: 0
TID: 0.96

## 2018-09-29 MED ORDER — REGADENOSON 0.4 MG/5ML IV SOLN
0.4000 mg | Freq: Once | INTRAVENOUS | Status: AC
  Administered 2018-09-29: 0.4 mg via INTRAVENOUS

## 2018-09-29 MED ORDER — TECHNETIUM TC 99M TETROFOSMIN IV KIT
10.2000 | PACK | Freq: Once | INTRAVENOUS | Status: AC | PRN
  Administered 2018-09-29: 10.2 via INTRAVENOUS
  Filled 2018-09-29: qty 11

## 2018-09-29 MED ORDER — TECHNETIUM TC 99M TETROFOSMIN IV KIT
32.9000 | PACK | Freq: Once | INTRAVENOUS | Status: AC | PRN
  Administered 2018-09-29: 32.9 via INTRAVENOUS
  Filled 2018-09-29: qty 33

## 2018-09-29 NOTE — Telephone Encounter (Signed)
COVID-19 Pre-Screening Questions:  . Do you currently have a fever?NO (yes = cancel and refer to pcp for e-visit) . Have you recently travelled on a cruise, internationally, or to [place], [place], [place], [place], [place], or [place], [place] [employer]) ?NO (yes = cancel, stay home, monitor symptoms, and contact pcp or initiate e-visit if symptoms develop) . Have you been in contact with someone that is currently pending confirmation of Covid19 testing or has been confirmed to have the [place] virus?  NO (yes = cancel, stay home, away from tested individual, monitor symptoms, and contact pcp or initiate e-visit if symptoms develop) . Are you currently experiencing fatigue or cough?NO (yes = pt should be prepared to have a mask placed at the time of their visit). . Reiterated no additional visitors. Eartha Inch no earlier than 15 minutes before appointment time. . Please bring own mask.  Patient in office having Nuc test when screening questions asked.

## 2018-09-30 ENCOUNTER — Telehealth: Payer: Self-pay | Admitting: Cardiovascular Disease

## 2018-09-30 NOTE — Telephone Encounter (Signed)
New Message ° ° ° °Pt is returning a call  ° ° °Please call back  °

## 2018-10-01 ENCOUNTER — Other Ambulatory Visit: Payer: Self-pay | Admitting: Gynecology

## 2018-10-01 ENCOUNTER — Other Ambulatory Visit: Payer: Self-pay

## 2018-10-01 ENCOUNTER — Encounter (INDEPENDENT_AMBULATORY_CARE_PROVIDER_SITE_OTHER): Payer: Self-pay

## 2018-10-01 ENCOUNTER — Ambulatory Visit (HOSPITAL_COMMUNITY): Payer: 59 | Attending: Internal Medicine

## 2018-10-01 DIAGNOSIS — Z1231 Encounter for screening mammogram for malignant neoplasm of breast: Secondary | ICD-10-CM

## 2018-10-01 DIAGNOSIS — R0789 Other chest pain: Secondary | ICD-10-CM | POA: Insufficient documentation

## 2018-10-01 DIAGNOSIS — M4316 Spondylolisthesis, lumbar region: Secondary | ICD-10-CM | POA: Diagnosis not present

## 2018-10-01 DIAGNOSIS — M546 Pain in thoracic spine: Secondary | ICD-10-CM | POA: Diagnosis not present

## 2018-10-01 DIAGNOSIS — R079 Chest pain, unspecified: Secondary | ICD-10-CM

## 2018-10-01 DIAGNOSIS — R0600 Dyspnea, unspecified: Secondary | ICD-10-CM

## 2018-10-01 DIAGNOSIS — M545 Low back pain: Secondary | ICD-10-CM | POA: Diagnosis not present

## 2018-10-02 NOTE — Telephone Encounter (Signed)
Follow Up: ° ° ° °Returning your call,concerning her Echo results.  °

## 2018-10-02 NOTE — Telephone Encounter (Signed)
Called patient with results of myoview and echo.

## 2018-10-06 ENCOUNTER — Telehealth: Payer: Self-pay | Admitting: Neurology

## 2018-10-06 NOTE — Telephone Encounter (Signed)
Due to current COVID 19 pandemic, our office is severely reducing in office visits until further notice, in order to minimize the risk to our patients and healthcare providers.   Called patient and confirmed a virtual visit for her 6/1 appointment. Patient verbalized understanding of the doxy.me process and I have sent her an e-mail to [email or web address] with link and directions as well as my name and office number/hours for reference. Patient understands that she will receive a call from RN to update chart.  Pt understands that although there may be some limitations with this type of visit, we will take all precautions to reduce any security or privacy concerns.  Pt understands that this will be treated like an in office visit and we will file with pt's insurance, and there may be a patient responsible charge related to this service.

## 2018-10-08 NOTE — Telephone Encounter (Signed)
I called pt. Pt's meds, allergies, and PMH were updated.  Pt reports that her tremors and balance are worsening.  Pt understands the doxy.me process and has no questions regarding it.

## 2018-10-12 ENCOUNTER — Ambulatory Visit: Payer: 59 | Admitting: Neurology

## 2018-10-12 DIAGNOSIS — R87610 Atypical squamous cells of undetermined significance on cytologic smear of cervix (ASC-US): Secondary | ICD-10-CM

## 2018-10-12 HISTORY — DX: Atypical squamous cells of undetermined significance on cytologic smear of cervix (ASC-US): R87.610

## 2018-10-12 NOTE — Telephone Encounter (Signed)
Patient called me this morning just before her 9:30 appointment and requested an in office visit as she has concerns that she feels can not be addressed with a video visit regarding balance/gait and possible vertigo. I rescheduled patient for an in office visit for 6/8 and explained to her the precautions that we are taking to keep patients and staff members safe during the pandemic. Patient understands that she will have her temperature taken upon arrival and will be asked screening questions regarding the virus. Patient understands that she will need to wear a mask and only have one person accompany her only if necessary. Patient verbalized understanding.

## 2018-10-19 ENCOUNTER — Telehealth: Payer: Self-pay | Admitting: Neurology

## 2018-10-19 ENCOUNTER — Ambulatory Visit: Payer: 59 | Admitting: Neurology

## 2018-10-19 NOTE — Telephone Encounter (Signed)
Patient called and CX her apt for today. Patient's son could not bring her patient will call back to schedule when she is for sure of transportation.

## 2018-11-02 ENCOUNTER — Other Ambulatory Visit: Payer: Self-pay

## 2018-11-03 ENCOUNTER — Ambulatory Visit (INDEPENDENT_AMBULATORY_CARE_PROVIDER_SITE_OTHER): Payer: 59 | Admitting: Gynecology

## 2018-11-03 ENCOUNTER — Encounter: Payer: Self-pay | Admitting: Gynecology

## 2018-11-03 VITALS — BP 122/76 | Ht 63.0 in | Wt 197.0 lb

## 2018-11-03 DIAGNOSIS — Z01419 Encounter for gynecological examination (general) (routine) without abnormal findings: Secondary | ICD-10-CM | POA: Diagnosis not present

## 2018-11-03 DIAGNOSIS — N952 Postmenopausal atrophic vaginitis: Secondary | ICD-10-CM

## 2018-11-03 DIAGNOSIS — D259 Leiomyoma of uterus, unspecified: Secondary | ICD-10-CM | POA: Diagnosis not present

## 2018-11-03 NOTE — Progress Notes (Signed)
    Noga Prunty July 10, 1955 [id number]        63 y.o.  G1P1001 for annual gynecologic exam.  Has not been in the office for several years.  Without gynecologic complaints.  Past medical history,surgical history, problem list, medications, allergies, family history and social history were all reviewed and documented as reviewed in the EPIC chart.  ROS:  Performed with pertinent positives and negatives included in the history, assessment and plan.   Additional significant findings : None   Exam: Caryn Bee assistant Vitals:   11/03/18 0924  BP: 122/76  Weight: 197 lb (89.4 kg)  Height: 5\' 3"  (1.6 m)   Body mass index is 34.9 kg/m.  General appearance:  Normal affect, orientation and appearance. Skin: Grossly normal HEENT: Without gross lesions.  No cervical or supraclavicular adenopathy. Thyroid normal.  Lungs:  Clear without wheezing, rales or rhonchi Cardiac: RR, without RMG Abdominal:  Soft, nontender, without masses, guarding, rebound, organomegaly or hernia Breasts:  Examined lying and sitting without masses, retractions, discharge or axillary adenopathy. Pelvic:  Ext, BUS, Vagina: With atrophic changes  Cervix: With atrophic changes.  Pap smear done  Uterus: Retroverted, normal size, shape and contour, midline and mobile nontender   Adnexa: Without masses or tenderness    Anus and perineum: Normal   Rectovaginal: Normal sphincter tone without palpated masses or tenderness.    Assessment/Plan:  63 y.o. G50P1001 female for annual gynecologic exam.   1. Postmenopausal/atrophic genital changes.  No significant menopausal symptoms or any bleeding. 2. History of multiple small myomas.  Uterine size is normal on exam.  Continue with annual exam monitoring. 3. Pap smear reported 2017 elsewhere.  Pap smear done today.  No history of abnormal Pap smears previously. 4. DEXA 2012 normal.  Recommend repeat DEXA next year at age 24. 6. Mammography scheduled and she will  follow-up for this.  Breast exam normal today. 6. Colonoscopy never.  Reports doing Cologuard through Dr. Jodi Mourning office.  I discussed false positive and false negative issues with Cologuard and argument of colonoscopy being gold standard.  Encouraged her to further discuss with Dr. Jacelyn Grip. 7. Health maintenance.  No routine lab work done as patient does this through Dr. Jodi Mourning office.  Follow-up 1 year, sooner as needed.   Anastasio Auerbach MD, 9:50 AM 11/03/2018

## 2018-11-03 NOTE — Addendum Note (Signed)
Addended by: Nelva Nay on: 11/03/2018 10:07 AM   Modules accepted: Orders

## 2018-11-03 NOTE — Patient Instructions (Signed)
Follow-up in 1 year for annual exam, sooner as needed. 

## 2018-11-05 ENCOUNTER — Encounter: Payer: Self-pay | Admitting: Gynecology

## 2018-11-05 LAB — PAP IG W/ RFLX HPV ASCU

## 2018-11-05 LAB — HUMAN PAPILLOMAVIRUS, HIGH RISK: HPV DNA High Risk: NOT DETECTED

## 2018-11-19 ENCOUNTER — Other Ambulatory Visit: Payer: Self-pay

## 2018-11-19 ENCOUNTER — Ambulatory Visit
Admission: RE | Admit: 2018-11-19 | Discharge: 2018-11-19 | Disposition: A | Discharge status: Home / Self Care | Payer: 59 | Source: Ambulatory Visit | Attending: Gynecology | Admitting: Gynecology

## 2018-11-19 DIAGNOSIS — Z1231 Encounter for screening mammogram for malignant neoplasm of breast: Secondary | ICD-10-CM

## 2019-02-09 ENCOUNTER — Encounter: Payer: Self-pay | Admitting: Gynecology

## 2019-11-04 ENCOUNTER — Encounter: Payer: 59 | Admitting: Obstetrics and Gynecology

## 2019-11-04 ENCOUNTER — Encounter: Payer: 59 | Admitting: Gynecology

## 2020-08-14 ENCOUNTER — Other Ambulatory Visit: Payer: Self-pay | Admitting: Family Medicine

## 2020-08-14 DIAGNOSIS — Z1231 Encounter for screening mammogram for malignant neoplasm of breast: Secondary | ICD-10-CM

## 2020-10-03 ENCOUNTER — Other Ambulatory Visit: Payer: Self-pay

## 2020-10-03 ENCOUNTER — Ambulatory Visit
Admission: RE | Admit: 2020-10-03 | Discharge: 2020-10-03 | Disposition: A | Discharge status: Home / Self Care | Payer: 59 | Source: Ambulatory Visit | Attending: Family Medicine | Admitting: Family Medicine

## 2020-10-03 DIAGNOSIS — Z1231 Encounter for screening mammogram for malignant neoplasm of breast: Secondary | ICD-10-CM

## 2020-12-30 IMAGING — MG DIGITAL SCREENING BILATERAL MAMMOGRAM WITH TOMO AND CAD
8 series · 8 of 24 positions shown · non-contrast
Comparison: Previous exam(s).

CLINICAL DATA: Screening.

EXAM:
DIGITAL SCREENING BILATERAL MAMMOGRAM WITH TOMO AND CAD

[L MLO synth-2D]
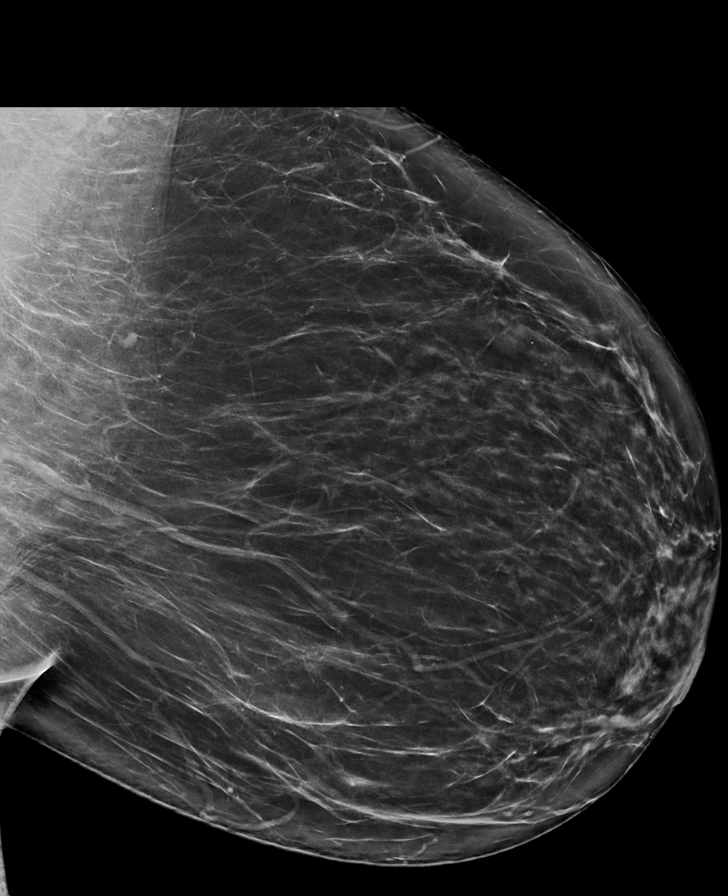

[R MLO synth-2D]
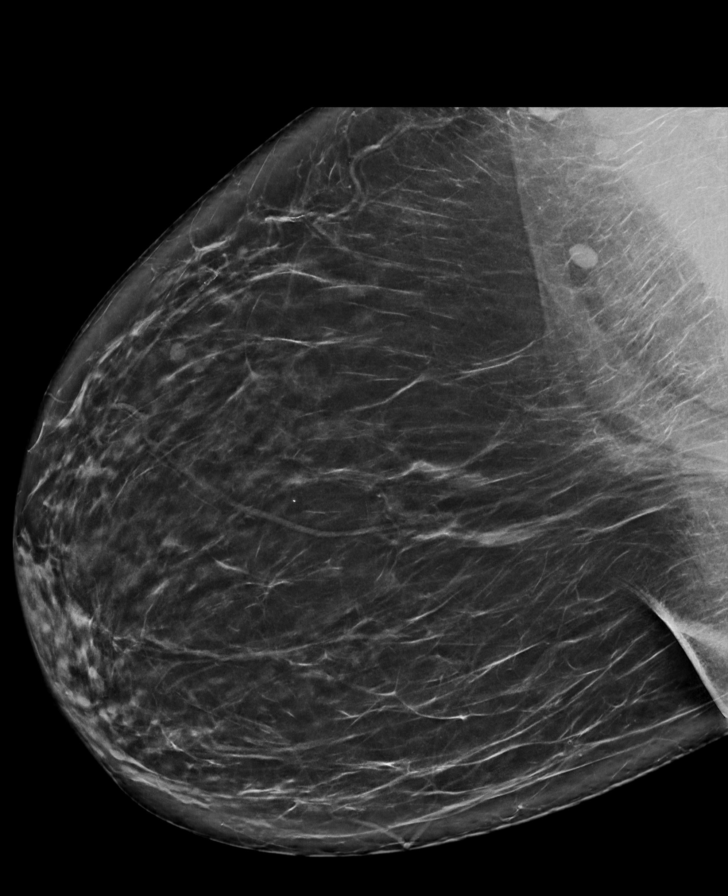

[R CC synth-2D]
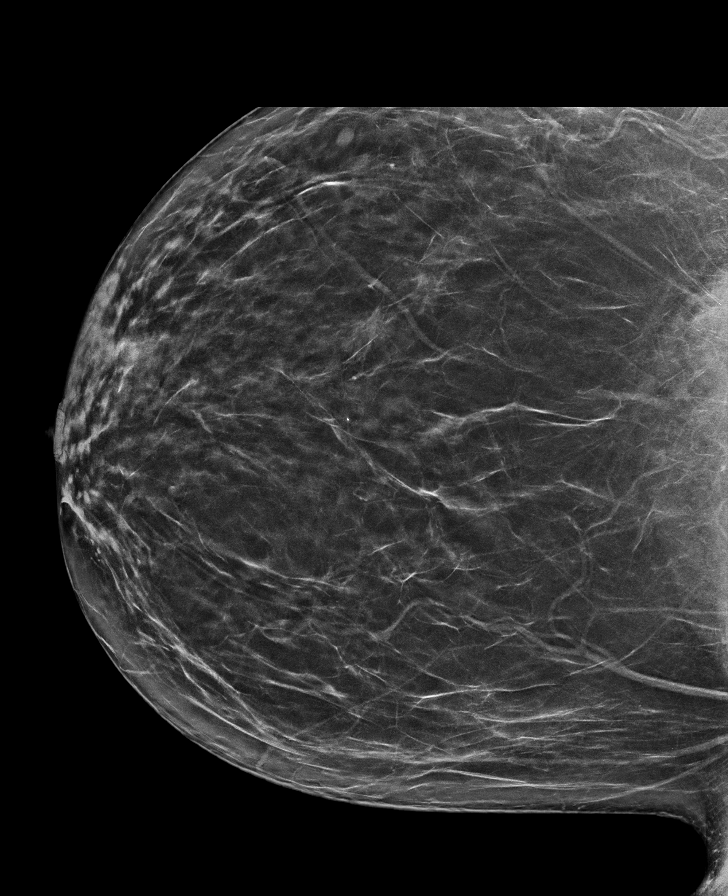

[L CC synth-2D]
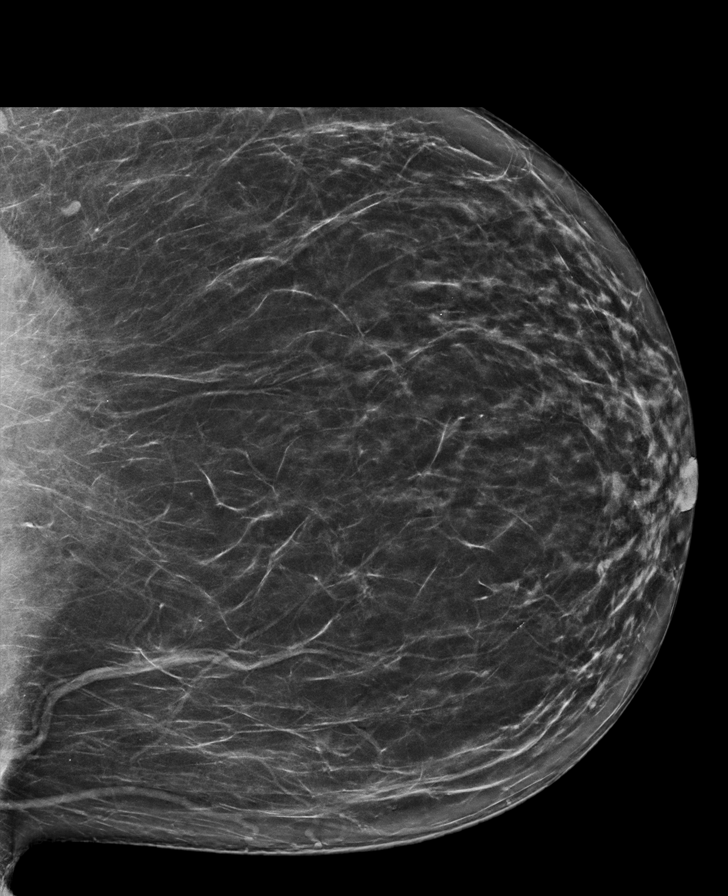

[L CC tomo · tomo slice 37/74.0]
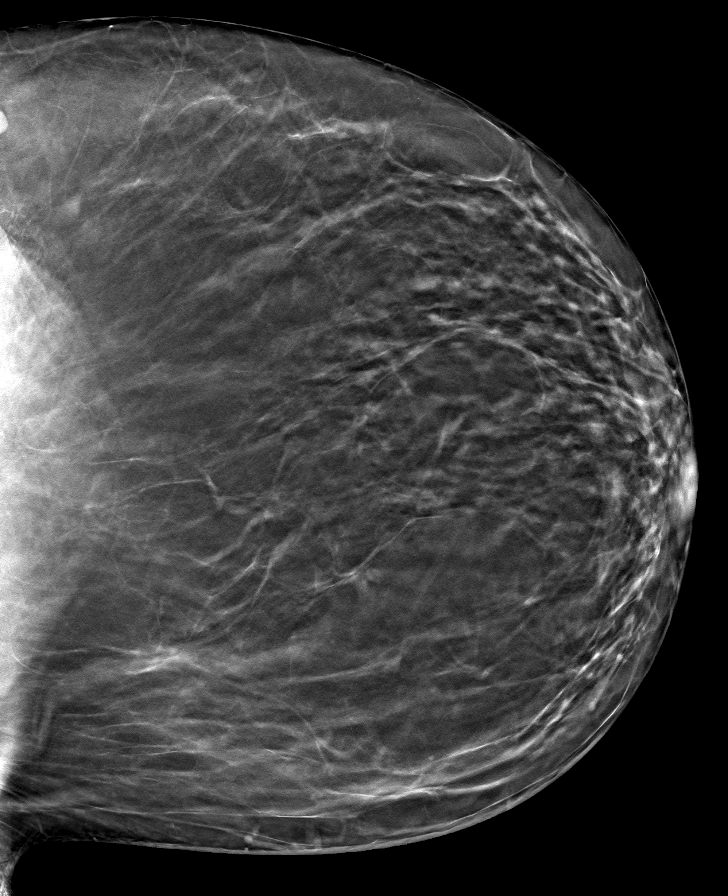

[R MLO tomo · tomo slice 43/86.0]
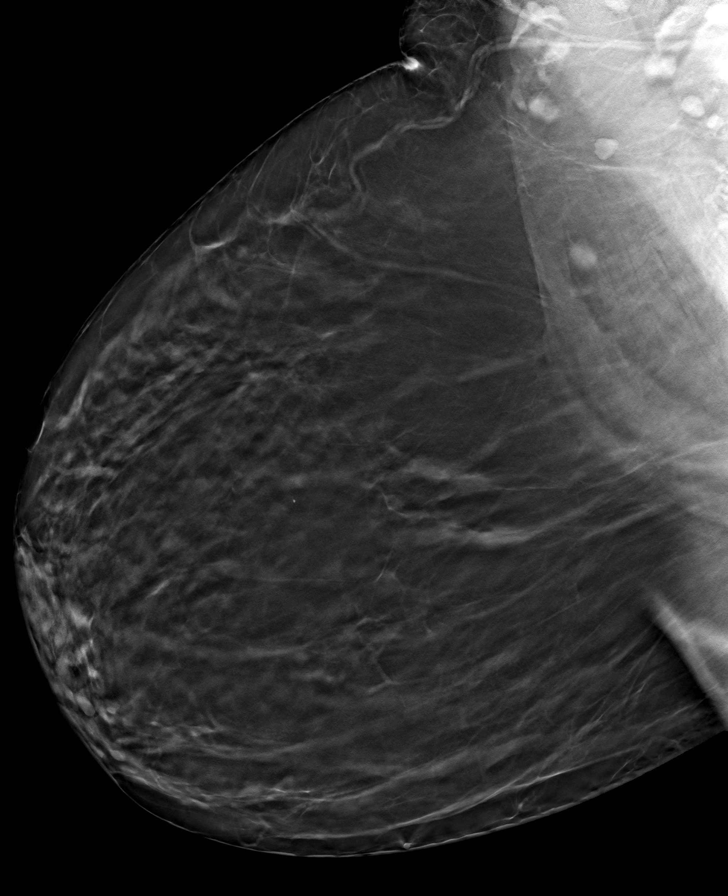

[R CC tomo · tomo slice 38/75.0]
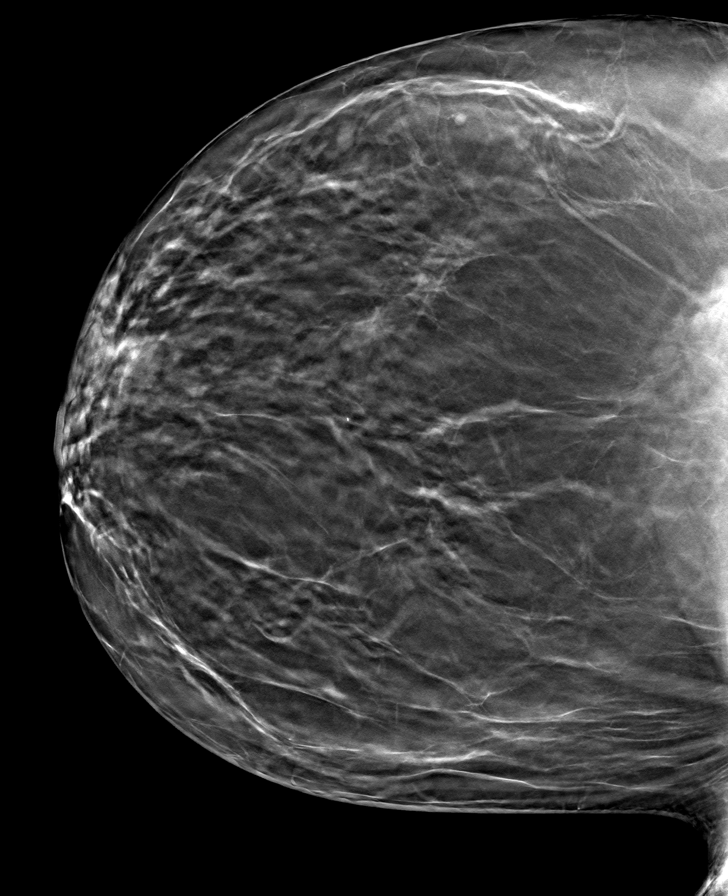

[L MLO tomo · tomo slice 42/83.0]
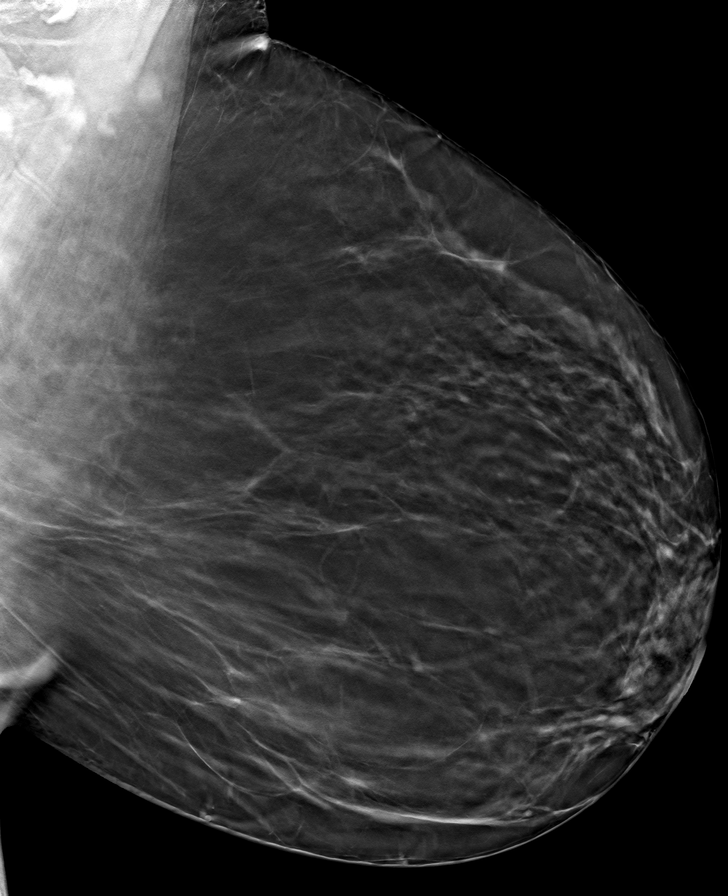

[8 of 24 positions shown; findings below may reference images not displayed]

ACR Breast Density Category b: There are scattered areas of
fibroglandular density.
FINDINGS: There are no findings suspicious for malignancy. Images were
processed with CAD.
IMPRESSION: No mammographic evidence of malignancy. A result letter of this
screening mammogram will be mailed directly to the patient.

RECOMMENDATION:
Screening mammogram in one year. (Code:[id number])

BI-RADS CATEGORY  1: Negative.

## 2021-09-28 ENCOUNTER — Ambulatory Visit: Payer: Medicare Other | Admitting: Family Medicine

## 2021-09-28 ENCOUNTER — Encounter: Payer: Self-pay | Admitting: Family Medicine

## 2021-09-28 VITALS — BP 102/70 | Ht 63.0 in | Wt 190.0 lb

## 2021-09-28 DIAGNOSIS — S838X2A Sprain of other specified parts of left knee, initial encounter: Secondary | ICD-10-CM

## 2021-09-28 MED ORDER — METHYLPREDNISOLONE ACETATE 40 MG/ML IJ SUSP
40.0000 mg | Freq: Once | INTRAMUSCULAR | Status: AC
  Administered 2021-09-28: 40 mg via INTRA_ARTICULAR

## 2021-09-28 NOTE — Assessment & Plan Note (Signed)
Symptoms most consistent with moderate meniscal injury.  Not getting better with conservative treatment.  We discussed in she opted to go for corticosteroid injection.  We will see her back in 4 to 6 weeks.  If she is not improving, would pursue further imaging, possibly MRI, possibly surgical evaluation.  She spends alternating 3-week blocks here and in [place] for her mother.  I told her we would be available when she was.

## 2021-09-28 NOTE — Progress Notes (Signed)
  Kathleen Novak - 66 y.o. female MRN [id number]  Date of birth: [birth year]    SUBJECTIVE:      Chief Complaint:/ HPI:    Left knee pain for 4 to 6 weeks.  Thinks she may have twisted it is inciting injury.  Worst pain is when she is trying to get up from a chair.  Some pain with going up steps.  Bothers her with activity most but having some pain after activity.  Mostly medially located.  Occasionally feels like it is going to give out but it has not.  No locking.  No prior injury to the knee.   OBJECTIVE: BP 102/70   Ht '5\' 3"'$  (1.6 m)   Wt 190 lb (86.2 kg)   BMI 33.66 kg/m   Physical Exam:  Vital signs are reviewed. GENERAL: Well-developed female no acute distress KNEES: Symmetrical.  Left knee has full range of motion flexion extension.  Some pain with last few degrees of full extension.  Popliteal space is slightly full mildly tender but no specific mass.  She is ligamentously intact to varus and valgus stress.  Moderate pain with McMurray testing, no pop or click. VASCULAR: Normal capillary refill lower extremity bilaterally.  Left dorsalis pedis pulse 2+.  PROCEDURE: INJECTION: Patient was given informed consent, signed copy in the chart. Appropriate time out was taken. Area prepped and draped in usual sterile fashion. Ethyl chloride was  used for local anesthesia. A 21 gauge 1 1/2 inch needle was used..  1 cc of methylprednisolone 40 mg/ml plus 4 cc of 1% lidocaine without epinephrine was injected into the left knee using a(n) anterior medial approach.   The patient tolerated the procedure well. There were no complications. Post procedure instructions were given.   ASSESSMENT & PLAN:  See problem based charting & AVS for pt instructions. Injury of meniscus of left knee Symptoms most consistent with moderate meniscal injury.  Not getting better with conservative treatment.  We discussed in she opted to go for corticosteroid injection.  We will see her back in 4 to 6  weeks.  If she is not improving, would pursue further imaging, possibly MRI, possibly surgical evaluation.  She spends alternating 3-week blocks here and in [place] for her mother.  I told her we would be available when she was.

## 2022-10-05 ENCOUNTER — Other Ambulatory Visit: Payer: Self-pay

## 2022-10-05 ENCOUNTER — Encounter (HOSPITAL_BASED_OUTPATIENT_CLINIC_OR_DEPARTMENT_OTHER): Payer: Self-pay | Admitting: Emergency Medicine

## 2022-10-05 ENCOUNTER — Emergency Department (HOSPITAL_BASED_OUTPATIENT_CLINIC_OR_DEPARTMENT_OTHER)
Admission: EM | Admit: 2022-10-05 | Discharge: 2022-10-05 | Disposition: A | Discharge status: Home / Self Care | Payer: Medicare Other | Attending: Emergency Medicine | Admitting: Emergency Medicine

## 2022-10-05 DIAGNOSIS — K0889 Other specified disorders of teeth and supporting structures: Secondary | ICD-10-CM | POA: Diagnosis present

## 2022-10-05 DIAGNOSIS — S0993XA Unspecified injury of face, initial encounter: Secondary | ICD-10-CM

## 2022-10-05 DIAGNOSIS — Z7982 Long term (current) use of aspirin: Secondary | ICD-10-CM | POA: Insufficient documentation

## 2022-10-05 MED ORDER — AMOXICILLIN-POT CLAVULANATE 875-125 MG PO TABS
1.0000 | ORAL_TABLET | Freq: Once | ORAL | Status: AC
  Administered 2022-10-05: 1 via ORAL
  Filled 2022-10-05: qty 1

## 2022-10-05 MED ORDER — AMOXICILLIN-POT CLAVULANATE 875-125 MG PO TABS: 1.0000 | ORAL_TABLET | Freq: Two times a day (BID) | ORAL | 0 refills | Status: AC

## 2022-10-05 NOTE — ED Notes (Signed)
Dr Branham in room w/pt now. 

## 2022-10-05 NOTE — Discharge Instructions (Signed)
Take Tylenol and ibuprofen as needed for pain control as well as using the take-home jelly putty we have provided you with today.  You can also buy an over-the-counter mouthguard to help protect your mouth from the broken tooth.  Take the antibiotics as prescribed.  Follow-up with your dentist as planned.  Come back for any severe worsening pain, worsening swelling, difficulty breathing, uncontrolled drooling, or any other symptoms concerning to you.

## 2022-10-05 NOTE — ED Triage Notes (Signed)
Pt presents to ED POV. Pt c/o tooth pain on L side x1w. Pt reports that she may have broken her tooth on corn chips. Reports she is not able to be seen by dentist until 5/30

## 2022-10-05 NOTE — ED Provider Notes (Signed)
[place] EMERGENCY DEPARTMENT AT [facility] Provider Note   CSN: [id number] Arrival date & time: 10/05/22  1106     History  Chief Complaint  Patient presents with   Dental Pain    Kathleen Novak is a 67 y.o. female.  With PMH of allergies presenting with left-sided posterior molar dental pain over the past week.  Patient ate a bag of hardships about a week ago and heard a crack in her left posterior molar.  She thinks she cracked the back of her tooth because there is a sharp part that has been cutting the inside of her cheek.  She has been having pain in that tooth since.  She has had some mild associated swelling.  No fevers, no chills, no vomiting, no facial swelling, no drooling, no difficulty maintaining secretions or voice change.  She has a follow-up planned with her dentist on the 30th of this month.   Dental Pain      Home Medications Prior to Admission medications   Medication Sig Start Date End Date Taking? Authorizing Provider  amoxicillin-clavulanate (AUGMENTIN) 875-125 MG tablet Take 1 tablet by mouth every 12 (twelve) hours. 10/05/22  Yes Mardene Sayer, MD  aspirin 81 MG tablet Take 81 mg by mouth daily.    [provider]  calcium carbonate (CALCIUM 600) 600 MG TABS tablet Take 600 mg by mouth daily with breakfast.    [provider]  cholecalciferol (VITAMIN D) 1000 units tablet Take 1,000 Units by mouth daily.    [provider]  Garlic 300 MG CAPS Take 1 capsule by mouth daily.    [provider]      Allergies    Other    Review of Systems   Review of Systems  Physical Exam Updated Vital Signs BP 115/73   Pulse (!) 59   Temp 97.9 F (36.6 C) (Oral)   Resp 17   SpO2 97%  Physical Exam Constitutional: Alert and oriented. Well appearing and in no distress. Eyes: Conjunctivae are normal. ENT      Head: Normocephalic and atraumatic.      Nose: No congestion.      Mouth/Throat: Mucous  membranes are moist. Small ellis 1 dental fx, no root or pulp exposure of posterior left inferior molar with associated filling present. Small superficial cut of intraoral left cheek no hemorrhage. No tongue rased. No facial edema.       Neck: No  drooling, no stridor, no tripoding Cardiovascular: RRR Respiratory: Normal respiratory effort. Breath sounds are normal. Musculoskeletal: Normal range of motion in all extremities. Neurologic: Normal speech and language. No gross focal neurologic deficits are appreciated. Skin: Skin is warm, dry and intact. No rash noted. Psychiatric: Mood and affect are normal. Speech and behavior are normal.  ED Results / Procedures / Treatments   Labs (all labs ordered are listed, but only abnormal results are displayed) Labs Reviewed - No data to display  EKG None  Radiology No results found.  Procedures Procedures    Medications Ordered in ED Medications  amoxicillin-clavulanate (AUGMENTIN) 875-125 MG per tablet 1 tablet (1 tablet Oral Given 10/05/22 1427)    ED Course/ Medical Decision Making/ A&P                             Medical Decision Making  Kathleen Novak is a 67 y.o. female.  With PMH of allergies presenting with left-sided posterior molar  dental pain over the past week.   Patient well-appearing and nontoxic.  No clinical signs or symptoms suggestive of ludwigs angina.  No facial swelling or edema concerning for submandibular or submental abscess.  No periapical abscess present.  Patient has small left inferior posterior molar Ellis 1 dental fracture.  There is no dentin or pulp exposure.  She was advised to continue Tylenol and ibuprofen for pain control and provided with take-home Orajel for pain.  Also given 1 week Augmentin course.  She will follow-up with her dentist as planned on the 30th of this month.  Return precautions discussed.  Risk Prescription drug management.      Final Clinical Impression(s) / ED  Diagnoses Final diagnoses:  Pain, dental  Dental trauma, initial encounter    Rx / DC Orders ED Discharge Orders          Ordered    amoxicillin-clavulanate (AUGMENTIN) 875-125 MG tablet  Every 12 hours        10/05/22 1430              Mardene Sayer, MD 10/05/22 1818

## 2023-10-14 ENCOUNTER — Other Ambulatory Visit: Payer: Self-pay | Admitting: Obstetrics and Gynecology

## 2023-10-14 DIAGNOSIS — Z1231 Encounter for screening mammogram for malignant neoplasm of breast: Secondary | ICD-10-CM

## 2023-11-04 ENCOUNTER — Ambulatory Visit
